# Patient Record
Sex: Female | Born: 1935 | Race: Black or African American | Hispanic: No | Smoking: Former smoker
Health system: Southern US, Community
[De-identification: ages and names within clinical notes are randomized; demographics above are authoritative.]

## PROBLEM LIST (undated history)

## (undated) DIAGNOSIS — I10 Essential (primary) hypertension: Secondary | ICD-10-CM

## (undated) DIAGNOSIS — I714 Abdominal aortic aneurysm, without rupture: Secondary | ICD-10-CM

## (undated) DIAGNOSIS — J189 Pneumonia, unspecified organism: Secondary | ICD-10-CM

## (undated) DIAGNOSIS — M1990 Unspecified osteoarthritis, unspecified site: Secondary | ICD-10-CM

## (undated) HISTORY — DX: Essential (primary) hypertension: I10

## (undated) HISTORY — DX: Abdominal aortic aneurysm, without rupture: I71.4

---

## 2012-01-15 DIAGNOSIS — J189 Pneumonia, unspecified organism: Secondary | ICD-10-CM

## 2012-01-15 DIAGNOSIS — I714 Abdominal aortic aneurysm, without rupture, unspecified: Secondary | ICD-10-CM

## 2012-01-15 HISTORY — DX: Pneumonia, unspecified organism: J18.9

## 2012-01-15 HISTORY — DX: Abdominal aortic aneurysm, without rupture, unspecified: I71.40

## 2012-01-15 HISTORY — DX: Abdominal aortic aneurysm, without rupture: I71.4

## 2012-07-27 ENCOUNTER — Encounter (HOSPITAL_BASED_OUTPATIENT_CLINIC_OR_DEPARTMENT_OTHER): Payer: Self-pay | Admitting: *Deleted

## 2012-07-27 ENCOUNTER — Emergency Department (HOSPITAL_BASED_OUTPATIENT_CLINIC_OR_DEPARTMENT_OTHER): Payer: Medicare Other

## 2012-07-27 ENCOUNTER — Inpatient Hospital Stay (HOSPITAL_BASED_OUTPATIENT_CLINIC_OR_DEPARTMENT_OTHER)
Admission: EM | Admit: 2012-07-27 | Discharge: 2012-07-28 | DRG: 690 | Disposition: A | Discharge status: Home / Self Care | Payer: Medicare Other | Attending: Internal Medicine | Admitting: Internal Medicine

## 2012-07-27 DIAGNOSIS — N390 Urinary tract infection, site not specified: Principal | ICD-10-CM | POA: Diagnosis present

## 2012-07-27 DIAGNOSIS — A419 Sepsis, unspecified organism: Secondary | ICD-10-CM

## 2012-07-27 DIAGNOSIS — J9819 Other pulmonary collapse: Secondary | ICD-10-CM | POA: Diagnosis present

## 2012-07-27 DIAGNOSIS — Z87891 Personal history of nicotine dependence: Secondary | ICD-10-CM

## 2012-07-27 DIAGNOSIS — D72829 Elevated white blood cell count, unspecified: Secondary | ICD-10-CM | POA: Diagnosis present

## 2012-07-27 DIAGNOSIS — D649 Anemia, unspecified: Secondary | ICD-10-CM | POA: Diagnosis present

## 2012-07-27 DIAGNOSIS — R531 Weakness: Secondary | ICD-10-CM | POA: Diagnosis present

## 2012-07-27 DIAGNOSIS — R5381 Other malaise: Secondary | ICD-10-CM | POA: Diagnosis present

## 2012-07-27 DIAGNOSIS — E876 Hypokalemia: Secondary | ICD-10-CM | POA: Diagnosis present

## 2012-07-27 HISTORY — DX: Unspecified osteoarthritis, unspecified site: M19.90

## 2012-07-27 LAB — CBC WITH DIFFERENTIAL/PLATELET
Basophils Absolute: 0 10*3/uL (ref 0.0–0.1)
Basophils Relative: 0 % (ref 0–1)
Eosinophils Relative: 0 % (ref 0–5)
Lymphocytes Relative: 4 % — ABNORMAL LOW (ref 12–46)
Lymphs Abs: 0.7 10*3/uL (ref 0.7–4.0)
MCH: 29.2 pg (ref 26.0–34.0)
MCHC: 36.4 g/dL — ABNORMAL HIGH (ref 30.0–36.0)
MCV: 79.6 fL (ref 78.0–100.0)
Monocytes Relative: 5 % (ref 3–12)
Myelocytes: 0 %
Neutro Abs: 6.5 10*3/uL (ref 1.7–7.7)
Neutrophils Relative %: 64 % (ref 43–77)
Platelets: 183 10*3/uL (ref 150–400)
Platelets: 170 10*3/uL (ref 150–400)
Promyelocytes Absolute: 0 %
RBC: 3.48 MIL/uL — ABNORMAL LOW (ref 3.87–5.11)
WBC Morphology: INCREASED
WBC: 17.2 10*3/uL — ABNORMAL HIGH (ref 4.0–10.5)
nRBC: 0 /100 WBC

## 2012-07-27 LAB — URINALYSIS, ROUTINE W REFLEX MICROSCOPIC
Glucose, UA: NEGATIVE mg/dL
Hgb urine dipstick: NEGATIVE
Ketones, ur: 15 mg/dL — AB
Ketones, ur: NEGATIVE mg/dL
Nitrite: NEGATIVE
Protein, ur: NEGATIVE mg/dL
Urobilinogen, UA: 4 mg/dL — ABNORMAL HIGH (ref 0.0–1.0)
pH: 5.5 (ref 5.0–8.0)

## 2012-07-27 LAB — COMPREHENSIVE METABOLIC PANEL
ALT: 51 U/L — ABNORMAL HIGH (ref 0–35)
Alkaline Phosphatase: 230 U/L — ABNORMAL HIGH (ref 39–117)
BUN: 18 mg/dL (ref 6–23)
Calcium: 8 mg/dL — ABNORMAL LOW (ref 8.4–10.5)
GFR calc Af Amer: 52 mL/min — ABNORMAL LOW (ref >90)
GFR calc Af Amer: 54 mL/min — ABNORMAL LOW (ref >90)
Glucose, Bld: 96 mg/dL (ref 70–99)
Glucose, Bld: 130 mg/dL — ABNORMAL HIGH (ref 70–99)
Potassium: 2.7 mEq/L — CL (ref 3.5–5.1)
Sodium: 136 mEq/L (ref 135–145)
Sodium: 136 mEq/L (ref 135–145)
Total Protein: 6.6 g/dL (ref 6.0–8.3)
Total Protein: 6.2 g/dL (ref 6.0–8.3)

## 2012-07-27 LAB — URINE MICROSCOPIC-ADD ON

## 2012-07-27 LAB — MAGNESIUM: Magnesium: 2 mg/dL (ref 1.5–2.5)

## 2012-07-27 LAB — PROTIME-INR
INR: 1.21 (ref 0.00–1.49)
Prothrombin Time: 15 seconds (ref 11.6–15.2)

## 2012-07-27 LAB — TSH: TSH: 0.799 u[IU]/mL (ref 0.350–4.500)

## 2012-07-27 LAB — APTT: aPTT: 31 seconds (ref 24–37)

## 2012-07-27 LAB — CG4 I-STAT (LACTIC ACID): Lactic Acid, Venous: 3.61 mmol/L — ABNORMAL HIGH (ref 0.5–2.2)

## 2012-07-27 MED ORDER — SODIUM CHLORIDE 0.9 % IJ SOLN: 3.0000 mL | Freq: Two times a day (BID) | INTRAMUSCULAR | Status: DC

## 2012-07-27 MED ORDER — SENNOSIDES-DOCUSATE SODIUM 8.6-50 MG PO TABS
1.0000 | ORAL_TABLET | Freq: Every evening | ORAL | Status: DC | PRN
  Filled 2012-07-27: qty 1

## 2012-07-27 MED ORDER — POTASSIUM CHLORIDE CRYS ER 20 MEQ PO TBCR
40.0000 meq | EXTENDED_RELEASE_TABLET | Freq: Once | ORAL | Status: AC
  Administered 2012-07-27: 40 meq via ORAL
  Filled 2012-07-27: qty 2

## 2012-07-27 MED ORDER — ONDANSETRON HCL 4 MG PO TABS: 4.0000 mg | ORAL_TABLET | Freq: Four times a day (QID) | ORAL | Status: DC | PRN

## 2012-07-27 MED ORDER — ACETAMINOPHEN 650 MG RE SUPP: 650.0000 mg | Freq: Four times a day (QID) | RECTAL | Status: DC | PRN

## 2012-07-27 MED ORDER — SODIUM CHLORIDE 0.9 % IV SOLN
INTRAVENOUS | Status: DC
  Administered 2012-07-27: 15:00:00 via INTRAVENOUS

## 2012-07-27 MED ORDER — CEFTRIAXONE SODIUM 1 G IJ SOLR
1.0000 g | INTRAMUSCULAR | Status: DC
  Administered 2012-07-27: 1 g via INTRAVENOUS
  Filled 2012-07-27 (×2): qty 10

## 2012-07-27 MED ORDER — ONDANSETRON HCL 4 MG/2ML IJ SOLN: 4.0000 mg | Freq: Four times a day (QID) | INTRAMUSCULAR | Status: DC | PRN

## 2012-07-27 MED ORDER — HYDROCODONE-ACETAMINOPHEN 5-325 MG PO TABS: 1.0000 | ORAL_TABLET | ORAL | Status: DC | PRN

## 2012-07-27 MED ORDER — MORPHINE SULFATE 2 MG/ML IJ SOLN: 1.0000 mg | INTRAMUSCULAR | Status: DC | PRN

## 2012-07-27 MED ORDER — ACETAMINOPHEN 325 MG PO TABS: 650.0000 mg | ORAL_TABLET | Freq: Four times a day (QID) | ORAL | Status: DC | PRN

## 2012-07-27 MED ORDER — SODIUM CHLORIDE 0.9 % IV SOLN
INTRAVENOUS | Status: AC
  Administered 2012-07-27: 14:00:00 via INTRAVENOUS

## 2012-07-27 MED ORDER — POTASSIUM CHLORIDE 10 MEQ/100ML IV SOLN
10.0000 meq | Freq: Once | INTRAVENOUS | Status: AC
  Administered 2012-07-27: 10 meq via INTRAVENOUS
  Filled 2012-07-27: qty 100

## 2012-07-27 NOTE — ED Notes (Signed)
Report given to [facility], Charity fundraiser. ETA 

## 2012-07-27 NOTE — ED Notes (Signed)
CRITICAL VALUE ALERT  Critical value received:  Potassium (K+) 2.7  Date of notification:  07/27/2012  Time of notification:  1046  Critical value read back:yes verified by Lavonna Rua, lab technician  Nurse who received alert:  Barron Alvine, RN  MD notified (1st page):  Dr. Judd Lien  Time of first page:  [phone]

## 2012-07-27 NOTE — ED Notes (Signed)
Report given to [facility] 1436-1.

## 2012-07-27 NOTE — ED Provider Notes (Signed)
History    CSN: [id number] Arrival date & time 07/27/12  4782  First MD Initiated Contact with Patient 07/27/12 [phone]     Chief Complaint  Patient presents with  . weak,dehydrated,decreased appetite    (Consider location/radiation/quality/duration/timing/severity/associated sxs/prior Treatment) HPI Comments: 79 year old female with no PMH, no meds.  Per family she hasn't been to the doctor in 30 years.  She presents today with complaints of weakness, nausea, decreased apetite, chills for the past several days.  She feels like her mouth is dry but has no other specific complaints.  No cough, shortness of breath, abd pain, dysuria, etc.    Patient is a 79 y.o. female presenting with weakness. The history is provided by the patient.  Weakness This is a new problem. Episode onset: several days ago. The problem occurs constantly. The problem has been gradually worsening. Pertinent negatives include no chest pain, no abdominal pain, no headaches and no shortness of breath. Nothing aggravates the symptoms. Nothing relieves the symptoms. She has tried nothing for the symptoms.   History reviewed. No pertinent past medical history. History reviewed. No pertinent past surgical history. History reviewed. No pertinent family history. History  Substance Use Topics  . Smoking status: Not on file  . Smokeless tobacco: Not on file  . Alcohol Use: No   OB History   Grav Para Term Preterm Abortions TAB SAB Ect Mult Living                 Review of Systems  Constitutional: Positive for chills, activity change, appetite change and fatigue. Negative for fever and diaphoresis.  Respiratory: Negative for shortness of breath.   Cardiovascular: Negative for chest pain.  Gastrointestinal: Negative for abdominal pain.  Neurological: Positive for weakness. Negative for headaches.  All other systems reviewed and are negative.    Allergies  Review of patient's allergies indicates no known  allergies.  Home Medications  No current outpatient prescriptions on file. BP 100/55  Pulse 113  Temp(Src) 100.6 F (38.1 C) (Oral)  Resp 18  Ht 5\' 4"  (1.626 m)  Wt 130 lb (58.968 kg)  BMI 22.3 kg/m2  SpO2 97% Physical Exam  Nursing note and vitals reviewed. Constitutional: She is oriented to person, place, and time. She appears well-developed and well-nourished. No distress.  Elderly female in no distress.  HENT:  Head: Normocephalic and atraumatic.  MM's are somewhat dry.  Eyes: EOM are normal. Pupils are equal, round, and reactive to light.  Neck: Normal range of motion. Neck supple.  Cardiovascular: Normal rate and regular rhythm.  Exam reveals no gallop and no friction rub.   No murmur heard. Pulmonary/Chest: Effort normal and breath sounds normal. No respiratory distress. She has no wheezes.  Abdominal: Soft. Bowel sounds are normal. She exhibits no distension. There is no tenderness.  Musculoskeletal: Normal range of motion. She exhibits no edema.  Neurological: She is alert and oriented to person, place, and time. No cranial nerve deficit. She exhibits normal muscle tone. Coordination normal.  Skin: Skin is warm and dry. She is not diaphoretic.    ED Course  Procedures (including critical care time) Labs Reviewed  CULTURE, BLOOD (ROUTINE X 2)  CULTURE, BLOOD (ROUTINE X 2)  URINE CULTURE  CBC WITH DIFFERENTIAL  COMPREHENSIVE METABOLIC PANEL  TROPONIN I  URINALYSIS, ROUTINE W REFLEX MICROSCOPIC  LACTIC ACID, PLASMA   No results found. No diagnosis found.   Date: 07/27/2012  Rate: 113  Rhythm: sinus tachycardia  QRS Axis: normal,  borderline LAD  Intervals: normal  ST/T Wave abnormalities: nonspecific T wave changes; septal infarct, age undetermined.  Conduction Disutrbances:none  Narrative Interpretation:   Old EKG Reviewed: none available    MDM  The vitals and workup are suggestive of dehydration, possible early uropsepsis.  She was also found to be  hypokalemic.  Potassium replaced, cultures obtained, will give rocephin.  I have spoken with Dr. Elisabeth Pigeon from [facility] (family requests this facility) who agrees to accept in transfer.    CRITICAL CARE Performed by: Geoffery Lyons Total critical care time: 30 minutes Critical care time was exclusive of separately billable procedures and treating other patients. Critical care was necessary to treat or prevent imminent or life-threatening deterioration. Critical care was time spent personally by me on the following activities: development of treatment plan with patient and/or surrogate as well as nursing, discussions with consultants, evaluation of patient's response to treatment, examination of patient, obtaining history from patient or surrogate, ordering and performing treatments and interventions, ordering and review of laboratory studies, ordering and review of radiographic studies, pulse oximetry and re-evaluation of patient's condition.    Geoffery Lyons, MD 07/27/12 [phone]

## 2012-07-27 NOTE — ED Notes (Signed)
Pt reports unable to void at this time. Specimen cup at bedside and patient instructed to use call bell when able to go to bathroom.

## 2012-07-27 NOTE — ED Notes (Signed)
Pt reports burning- No redness or infiltration noted. IV infusing well. Per request of patient, infusion rate decreased to 52mL/hr to aid in discomfort.

## 2012-07-27 NOTE — ED Notes (Signed)
Patient back from  X-ray 

## 2012-07-27 NOTE — ED Notes (Signed)
[facility] at bedside preparing pt for transport to [facility]

## 2012-07-27 NOTE — Progress Notes (Signed)
Patient does not want to sign up for My Chart. Pt does not want proxy forms for My Chart. Briscoe Burns BSN, RN-BC Admissions RN  07/27/2012 3:53 PM

## 2012-07-27 NOTE — ED Notes (Signed)
Patient transported to X-ray via stretcher 

## 2012-07-27 NOTE — ED Notes (Signed)
Pt eating waffle fries and applesauce, drinking water and ginger-ale-tolerating well at this time. No N/V episodes.

## 2012-07-27 NOTE — H&P (Signed)
[facility] History and Physical  Sydney Scott ZOX:[id number] DOB: [birth year] DOA: 07/27/2012  Referring physician: ER physician PCP: No PCP Per Patient   Chief Complaint: weakness, poor apetite  HPI:  79 year old female with no significant past medical history initially presented to [facility] for weakness, fatigue and poor oral intake for past few days prior to this admission. Family has noticed she has been engaging in conversation much less. Patient had fevers at home, unmeasured. No reports of dysuria or hematuria or frequency. No chest pain, no shortness of breath, no palpitations. No abdominal pain, nausea or vomiting. No falls or loss of consciousness. In ED, her vitals were as follows: BP 99/61, Tmax of 100.6 F, HR 99 and O2 saturation of 97% on room air. Her initial blood work was significant only for hypokalemia of 2.6 which was repleted in ED. Hemoglobin was 11.4. Urinalysis was significant for UTI.   Assessment and Plan:  Principal Problem:   Weakness - secondary to an acute infection, UTI - start rocephin - follow up urine culture results - PT eval Active Problems:   UTI (urinary tract infection) - started rocephin - follow up urine culture results   Leukocytosis, unspecified - secondary to UTI   Anemia - hemoglobin stable at 11.4 on admission   Hypokalemia - repleted in ED    Manson Passey [facility] [phone]  Review of Systems:  Constitutional: Negative for fever, chills and positive for malaise/fatigue. Negative for diaphoresis.  HENT: Negative for hearing loss, ear pain, nosebleeds, congestion, sore throat, neck pain, tinnitus and ear discharge.   Eyes: Negative for blurred vision, double vision, photophobia, pain, discharge and redness.  Respiratory: Negative for cough, hemoptysis, sputum production, shortness of breath, wheezing and stridor.   Cardiovascular: Negative for chest pain, palpitations, orthopnea, claudication and leg swelling.  Gastrointestinal:  Negative for nausea, vomiting and abdominal pain. Negative for heartburn, constipation, blood in stool and melena.  Genitourinary: Negative for dysuria, urgency, frequency, hematuria and flank pain.  Musculoskeletal: Negative for myalgias, back pain, joint pain and falls.  Skin: Negative for itching and rash.  Neurological: Negative for dizziness and positive for weakness. Negative for tingling, tremors, sensory change, speech change, focal weakness, loss of consciousness and headaches.  Endo/Heme/Allergies: Negative for environmental allergies and polydipsia. Does not bruise/bleed easily. positive for loss of apetite Psychiatric/Behavioral: Negative for suicidal ideas. The patient is not nervous/anxious.      Past Medical History  Diagnosis Date  . Arthritis    Past Surgical History  Procedure Laterality Date  . No past surgeries     Social History:  reports that she quit smoking about 34 years ago. Her smoking use included Cigarettes. She smoked 0.00 packs per day for 20 years. She has never used smokeless tobacco. She reports that she does not drink alcohol or use illicit drugs.  Allergies  Allergen Reactions  . Aspirin Nausea And Vomiting    Family History: Family medical history significant for HTN, HLD   Prior to Admission medications   Not on File   Physical Exam: Filed Vitals:   07/27/12 1040 07/27/12 1055 07/27/12 1259 07/27/12 1412  BP: 99/61 102/58 104/63 101/60  Pulse: 110 106 102 99  Temp:  99.5 F (37.5 C)  98.4 F (36.9 C)  TempSrc:  Oral  Oral  Resp: 18 18  20   Height:      Weight:    69.219 kg (152 lb 9.6 oz)  SpO2: 94% 96%  97%    Physical  Exam  Constitutional: Appears in no acute distress.  HENT: Normocephalic. External right and left ear normal. Oropharynx is clear and moist.  Eyes: Conjunctivae and EOM are normal. PERRLA, no scleral icterus.  Neck: Normal ROM. Neck supple. No JVD. No tracheal deviation. No thyromegaly.  CVS: RRR, S1/S2  appreciated  Pulmonary: Effort and breath sounds normal, no stridor, rhonchi, wheezes, rales.  Abdominal: Soft. BS +,  no distension, tenderness, rebound or guarding.  Musculoskeletal: Normal range of motion. No edema and no tenderness.  Lymphadenopathy: No lymphadenopathy noted, cervical, inguinal. Neuro: Alert. No focal neurologic deficits Skin: Skin is warm and dry. No rash noted. Not diaphoretic. No erythema. No pallor.    Labs on Admission:  Basic Metabolic Panel:  Recent Labs Lab 07/27/12 0950 07/27/12 1510  NA 136 136  K 2.7* 3.6  CL 99 102  CO2 19 21  GLUCOSE 96 130*  BUN 17 18  CREATININE 1.10 1.07  CALCIUM 8.7 8.0*  MG  --  2.0  PHOS  --  2.2*   Liver Function Tests:  Recent Labs Lab 07/27/12 0950 07/27/12 1510  AST 76* 79*  ALT 51* 46*  ALKPHOS 230* 182*  BILITOT 3.4* 3.1*  PROT 6.6 6.2  ALBUMIN 2.3* 2.0*   No results found for this basename: LIPASE, AMYLASE,  in the last 168 hours No results found for this basename: AMMONIA,  in the last 168 hours CBC:  Recent Labs Lab 07/27/12 0950 07/27/12 1510  WBC 7.3 17.2*  NEUTROABS 6.5 15.6*  HGB 11.4* 10.1*  HCT 31.3* 27.7*  MCV 80.1 79.6  PLT 183 170   Cardiac Enzymes:  Recent Labs Lab 07/27/12 0950  TROPONINI <0.30   BNP: No components found with this basename: POCBNP,  CBG: No results found for this basename: GLUCAP,  in the last 168 hours  Radiological Exams on Admission: Dg Chest 2 View 07/27/2012     IMPRESSION: Mild elevation of the right hemidiaphragm with right basilar atelectasis.  No segmental infiltrate or pulmonary edema.   Original Report Authenticated By: Natasha Mead, M.D.    Code Status: Full Family Communication: Pt at bedside Disposition Plan: Admit for further evaluation  Manson Passey, MD  [facility] Pager [phone]  If 7PM-7AM, please contact night-coverage [email or web address] Password [email or web address] 07/27/2012, 6:46 PM

## 2012-07-27 NOTE — ED Notes (Signed)
MD at bedside. 

## 2012-07-28 DIAGNOSIS — D649 Anemia, unspecified: Secondary | ICD-10-CM

## 2012-07-28 LAB — COMPREHENSIVE METABOLIC PANEL
ALT: 41 U/L — ABNORMAL HIGH (ref 0–35)
AST: 53 U/L — ABNORMAL HIGH (ref 0–37)
Albumin: 2 g/dL — ABNORMAL LOW (ref 3.5–5.2)
CO2: 24 mEq/L (ref 19–32)
Chloride: 108 mEq/L (ref 96–112)
GFR calc non Af Amer: 75 mL/min — ABNORMAL LOW (ref >90)
Sodium: 139 mEq/L (ref 135–145)
Total Bilirubin: 1.3 mg/dL — ABNORMAL HIGH (ref 0.3–1.2)

## 2012-07-28 LAB — CBC
Hemoglobin: 10.2 g/dL — ABNORMAL LOW (ref 12.0–15.0)
MCH: 28.4 pg (ref 26.0–34.0)
MCHC: 35.7 g/dL (ref 30.0–36.0)
MCV: 79.7 fL (ref 78.0–100.0)
Platelets: 197 10*3/uL (ref 150–400)
RBC: 3.59 MIL/uL — ABNORMAL LOW (ref 3.87–5.11)

## 2012-07-28 LAB — URINE CULTURE
Colony Count: NO GROWTH
Culture: NO GROWTH

## 2012-07-28 LAB — GLUCOSE, CAPILLARY: Glucose-Capillary: 97 mg/dL (ref 70–99)

## 2012-07-28 MED ORDER — CIPROFLOXACIN HCL 500 MG PO TABS: 500.0000 mg | ORAL_TABLET | Freq: Two times a day (BID) | ORAL | Status: DC

## 2012-07-28 NOTE — Discharge Summary (Signed)
Physician Discharge Summary  Sydney Scott WJX:[id number] DOB: 19-Mar-1927 DOA: 07/27/2012  PCP: No PCP Per Patient  Admit date: 07/27/2012 Discharge date: 07/28/2012  Recommendations for Outpatient Follow-up:  1. Pt will need to follow up with PCP in 2-3 weeks post discharge 2. Please obtain BMP to evaluate electrolytes and kidney function 3. Please also check CBC to evaluate Hg and Hct levels 4. Please note that pt was discharged home on Ciprofloxacin to complete the therapy for UTI for 5 more days post discharge  5. Please also note that blood culture obtained July 14th, 2014 grew MICROAEROPHILIC STREPTOCOCCI 1/2 and this was discussed with ID, determined to be skin contamination 6. Please also note that [facility] PT services offered but was walking independently in hallway, not requiring assistance 7. Please also note that pt was advised to follow up with PCP as soon as possible if her symptoms get worse   Discharge Diagnoses:  Principal Problem:   Weakness Active Problems:   UTI (urinary tract infection)   Leukocytosis, unspecified   Anemia   Hypokalemia  Discharge Condition: Stable  Diet recommendation: Heart healthy diet discussed in details   History of present illness:  79 year old female with no significant past medical history initially presented to [facility] for weakness, fatigue and poor oral intake for past few days prior to this admission. Family has noticed she has been engaging in conversation much less. Patient had fevers at home, unmeasured. No reports of dysuria or hematuria or frequency. No chest pain, no shortness of breath, no palpitations. No abdominal pain, nausea or vomiting. No falls or loss of consciousness.   In ED, her vitals were as follows: BP 99/61, Tmax of 100.6 F, HR 99 and O2 saturation of 97% on room air. Her initial blood work was significant only for hypokalemia of 2.6 which was repleted in ED. Hemoglobin was 11.4. Urinalysis was significant for UTI.    Assessment and Plan:  Principal Problem:  Weakness  - secondary to an acute infection, UTI  - started rocephin and transitioned to Ciprofloxacin upon discharge to complete the therapy for 5 more days post discharge - urine culture with no significant growth - PT eval done and determined pt has no acute home health needs  Active Problems:  UTI (urinary tract infection)  - started rocephin and transitioned to Cipro as noted above to complete therapy for 5 more days post discharge  - follow up urine culture results  Leukocytosis, unspecified  - secondary to UTI, WBC trending down   Anemia  - hemoglobin stable and no signs of acute bleeding while inpatient, no need for transfusion  Hypokalemia  - repleted in ED   Procedures/Studies: Dg Chest 2 View 07/27/2012    Mild elevation of the right hemidiaphragm with right basilar atelectasis.  No segmental infiltrate or pulmonary edema.    Consultations:  None  Antibiotics:  Rocephin 7/14 -->and transition to Ciprofloxacin on 7/15 for 5 more days post discharge   Discharge Exam: Filed Vitals:   07/28/12 0639  BP: 107/56  Pulse: 92  Temp: 97.8 F (36.6 C)  Resp: 18   Filed Vitals:   07/27/12 1412 07/27/12 2150 07/28/12 0500 07/28/12 0639  BP: 101/60 102/67  107/56  Pulse: 99 76  92  Temp: 98.4 F (36.9 C) 97.8 F (36.6 C)  97.8 F (36.6 C)  TempSrc: Oral Oral  Oral  Resp: 20 18  18   Height:      Weight: 69.219 kg (152 lb 9.6  oz)  69.809 kg (153 lb 14.4 oz) 69.809 kg (153 lb 14.4 oz)  SpO2: 97% 98%  99%    General: Pt is alert, follows commands appropriately, not in acute distress Cardiovascular: Regular rate and rhythm, S1/S2 +, no murmurs, no rubs, no gallops Respiratory: Clear to auscultation bilaterally, no wheezing, no crackles, no rhonchi Abdominal: Soft, non tender, non distended, bowel sounds +, no guarding Extremities: no edema, no cyanosis, pulses palpable bilaterally DP and PT Neuro: Grossly  nonfocal  Discharge Instructions  Discharge Orders   Future Orders Complete By Expires     Diet - low sodium heart healthy  As directed     Increase activity slowly  As directed         Medication List         ciprofloxacin 500 MG tablet  Commonly known as:  CIPRO  Take 1 tablet (500 mg total) by mouth 2 (two) times daily.           Follow-up Information   Follow up with Debbora Presto, MD. (As needed if symptoms worsen)    Contact information:   [address] [place] [address] [phone]        The results of significant diagnostics from this hospitalization (including imaging, microbiology, ancillary and laboratory) are listed below for reference.     Microbiology: Recent Results (from the past 240 hour(s))  CULTURE, BLOOD (ROUTINE X 2)     Status: None   Collection Time    07/27/12  9:50 AM      Result Value Range Status   Specimen Description BLOOD RT HAND   Final   Special Requests Normal BOTTLES DRAWN AEROBIC AND ANAEROBIC  4CC   Final   Culture  Setup Time 07/27/2012 15:07   Final   Culture     Final   Value:        BLOOD CULTURE RECEIVED NO GROWTH TO DATE CULTURE WILL BE HELD FOR 5 DAYS BEFORE ISSUING A FINAL NEGATIVE REPORT   Report Status PENDING   Incomplete  CULTURE, BLOOD (ROUTINE X 2)     Status: None   Collection Time    07/27/12 10:10 AM      Result Value Range Status   Specimen Description BLOOD LEFT HAND   Final   Special Requests Normal BOTTLES DRAWN AEROBIC ONLY  3CC   Final   Culture  Setup Time 07/27/2012 15:07   Final   Culture     Final   Value:        BLOOD CULTURE RECEIVED NO GROWTH TO DATE CULTURE WILL BE HELD FOR 5 DAYS BEFORE ISSUING A FINAL NEGATIVE REPORT   Report Status PENDING   Incomplete  CULTURE, BLOOD (SINGLE)     Status: None   Collection Time    07/27/12  1:00 PM      Result Value Range Status   Specimen Description BLOOD R HAND   Final   Special Requests BOTTLES DRAWN AEROBIC AND  ANAEROBIC 4.5 CC   Final   Culture  Setup Time 07/27/2012 15:07   Final   Culture     Final   Value:        BLOOD CULTURE RECEIVED NO GROWTH TO DATE CULTURE WILL BE HELD FOR 5 DAYS BEFORE ISSUING A FINAL NEGATIVE REPORT   Report Status PENDING   Incomplete     Labs: Basic Metabolic Panel:  Recent Labs Lab 07/27/12 0950 07/27/12 1510 07/28/12 0438  NA 136  136 139  K 2.7* 3.6 3.7  CL 99 102 108  CO2 19 21 24   GLUCOSE 96 130* 99  BUN 17 18 16   CREATININE 1.10 1.07 0.79  CALCIUM 8.7 8.0* 8.2*  MG  --  2.0  --   PHOS  --  2.2*  --    Liver Function Tests:  Recent Labs Lab 07/27/12 0950 07/27/12 1510 07/28/12 0438  AST 76* 79* 53*  ALT 51* 46* 41*  ALKPHOS 230* 182* 173*  BILITOT 3.4* 3.1* 1.3*  PROT 6.6 6.2 6.1  ALBUMIN 2.3* 2.0* 2.0*   CBC:  Recent Labs Lab 07/27/12 0950 07/27/12 1510 07/28/12 0438  WBC 7.3 17.2* 15.6*  NEUTROABS 6.5 15.6*  --   HGB 11.4* 10.1* 10.2*  HCT 31.3* 27.7* 28.6*  MCV 80.1 79.6 79.7  PLT 183 170 197   Cardiac Enzymes:  Recent Labs Lab 07/27/12 0950  TROPONINI <0.30   CBG:  Recent Labs Lab 07/28/12 0726  GLUCAP 97     SIGNED: Time coordinating discharge: Over 30 minutes  Debbora Presto, MD  [facility] 07/28/2012, 12:57 PM Pager [phone]  If 7PM-7AM, please contact night-coverage [email or web address] Password [email or web address]

## 2012-07-28 NOTE — Progress Notes (Signed)
PT Cancellation Note  Patient Details Name: Sydney Scott MRN: [id number] DOB: 1927/08/19   Cancelled Treatment:    Reason Eval/Treat Not Completed: Patient declined, no reason specified (pt stated she's walking fine and doesn't need PT). PT signing off.    Ralene Bathe Kistler 07/28/2012, 1:18 PM [phone]

## 2012-07-28 NOTE — Progress Notes (Signed)
[facility] called to report positive blood cultures in anaerobic bottle, gram + cocci in pair; notified Dr. Izola Price

## 2012-07-28 NOTE — Progress Notes (Signed)
Pt discharged home via family; Pt and family given and explained all discharge instructions, carenotes, and prescriptions; pt and family stated understanding and denied questions/concerns; all f/u appointments in place; IV removed without complicaitons; pt stable at time of discharge  

## 2012-07-30 LAB — CULTURE, BLOOD (ROUTINE X 2): Special Requests: NORMAL

## 2012-08-02 ENCOUNTER — Inpatient Hospital Stay (HOSPITAL_COMMUNITY)
Admission: EM | Admit: 2012-08-02 | Discharge: 2012-08-11 | DRG: 193 | Disposition: A | Discharge status: Home Health | Payer: Medicare Other | Attending: Internal Medicine | Admitting: Internal Medicine

## 2012-08-02 ENCOUNTER — Encounter (HOSPITAL_COMMUNITY): Payer: Self-pay | Admitting: *Deleted

## 2012-08-02 ENCOUNTER — Emergency Department (HOSPITAL_COMMUNITY): Payer: Medicare Other

## 2012-08-02 DIAGNOSIS — R627 Adult failure to thrive: Secondary | ICD-10-CM | POA: Diagnosis present

## 2012-08-02 DIAGNOSIS — E876 Hypokalemia: Secondary | ICD-10-CM | POA: Diagnosis present

## 2012-08-02 DIAGNOSIS — D72829 Elevated white blood cell count, unspecified: Secondary | ICD-10-CM | POA: Diagnosis present

## 2012-08-02 DIAGNOSIS — R7881 Bacteremia: Secondary | ICD-10-CM | POA: Diagnosis present

## 2012-08-02 DIAGNOSIS — I714 Abdominal aortic aneurysm, without rupture, unspecified: Secondary | ICD-10-CM | POA: Diagnosis present

## 2012-08-02 DIAGNOSIS — D509 Iron deficiency anemia, unspecified: Secondary | ICD-10-CM | POA: Diagnosis present

## 2012-08-02 DIAGNOSIS — D649 Anemia, unspecified: Secondary | ICD-10-CM

## 2012-08-02 DIAGNOSIS — R531 Weakness: Secondary | ICD-10-CM

## 2012-08-02 DIAGNOSIS — A491 Streptococcal infection, unspecified site: Secondary | ICD-10-CM | POA: Diagnosis present

## 2012-08-02 DIAGNOSIS — K8020 Calculus of gallbladder without cholecystitis without obstruction: Secondary | ICD-10-CM | POA: Diagnosis present

## 2012-08-02 DIAGNOSIS — Y95 Nosocomial condition: Secondary | ICD-10-CM | POA: Diagnosis present

## 2012-08-02 DIAGNOSIS — N390 Urinary tract infection, site not specified: Secondary | ICD-10-CM

## 2012-08-02 DIAGNOSIS — J189 Pneumonia, unspecified organism: Secondary | ICD-10-CM | POA: Diagnosis present

## 2012-08-02 DIAGNOSIS — K750 Abscess of liver: Secondary | ICD-10-CM | POA: Diagnosis present

## 2012-08-02 DIAGNOSIS — A599 Trichomoniasis, unspecified: Secondary | ICD-10-CM | POA: Diagnosis present

## 2012-08-02 DIAGNOSIS — Z87440 Personal history of urinary (tract) infections: Secondary | ICD-10-CM

## 2012-08-02 DIAGNOSIS — K5730 Diverticulosis of large intestine without perforation or abscess without bleeding: Secondary | ICD-10-CM | POA: Diagnosis present

## 2012-08-02 LAB — URINALYSIS, ROUTINE W REFLEX MICROSCOPIC
Glucose, UA: NEGATIVE mg/dL
Ketones, ur: 15 mg/dL — AB
Nitrite: NEGATIVE
Specific Gravity, Urine: 1.017 (ref 1.005–1.030)
pH: 6 (ref 5.0–8.0)

## 2012-08-02 LAB — CULTURE, BLOOD (SINGLE): Culture: NO GROWTH

## 2012-08-02 LAB — CULTURE, BLOOD (ROUTINE X 2): Special Requests: NORMAL

## 2012-08-02 LAB — URINE MICROSCOPIC-ADD ON

## 2012-08-02 LAB — BASIC METABOLIC PANEL
BUN: 9 mg/dL (ref 6–23)
CO2: 27 mEq/L (ref 19–32)
Calcium: 8.3 mg/dL — ABNORMAL LOW (ref 8.4–10.5)
Chloride: 98 mEq/L (ref 96–112)
Creatinine, Ser: 0.61 mg/dL (ref 0.50–1.10)
Glucose, Bld: 112 mg/dL — ABNORMAL HIGH (ref 70–99)

## 2012-08-02 LAB — CBC WITH DIFFERENTIAL/PLATELET
Basophils Absolute: 0 10*3/uL (ref 0.0–0.1)
Eosinophils Relative: 0 % (ref 0–5)
HCT: 27.8 % — ABNORMAL LOW (ref 36.0–46.0)
Hemoglobin: 10.1 g/dL — ABNORMAL LOW (ref 12.0–15.0)
Lymphocytes Relative: 10 % — ABNORMAL LOW (ref 12–46)
MCV: 77.9 fL — ABNORMAL LOW (ref 78.0–100.0)
Monocytes Absolute: 0.8 10*3/uL (ref 0.1–1.0)
Monocytes Relative: 4 % (ref 3–12)
Neutro Abs: 16.6 10*3/uL — ABNORMAL HIGH (ref 1.7–7.7)
RDW: 14.4 % (ref 11.5–15.5)
WBC: 19.3 10*3/uL — ABNORMAL HIGH (ref 4.0–10.5)

## 2012-08-02 LAB — POTASSIUM: Potassium: 3.1 mEq/L — ABNORMAL LOW (ref 3.5–5.1)

## 2012-08-02 LAB — LACTIC ACID, PLASMA: Lactic Acid, Venous: 1.6 mmol/L (ref 0.5–2.2)

## 2012-08-02 MED ORDER — SODIUM CHLORIDE 0.9 % IV BOLUS (SEPSIS)
1000.0000 mL | Freq: Once | INTRAVENOUS | Status: AC
  Administered 2012-08-02: 1000 mL via INTRAVENOUS

## 2012-08-02 MED ORDER — HEPARIN SODIUM (PORCINE) 5000 UNIT/ML IJ SOLN
5000.0000 [IU] | Freq: Three times a day (TID) | INTRAMUSCULAR | Status: DC
  Administered 2012-08-02 – 2012-08-11 (×25): 5000 [IU] via SUBCUTANEOUS
  Filled 2012-08-02 (×29): qty 1

## 2012-08-02 MED ORDER — POTASSIUM CHLORIDE CRYS ER 20 MEQ PO TBCR
40.0000 meq | EXTENDED_RELEASE_TABLET | Freq: Once | ORAL | Status: AC
  Administered 2012-08-02: 40 meq via ORAL
  Filled 2012-08-02: qty 2

## 2012-08-02 MED ORDER — PIPERACILLIN-TAZOBACTAM 3.375 G IVPB
3.3750 g | Freq: Three times a day (TID) | INTRAVENOUS | Status: DC
  Administered 2012-08-03 – 2012-08-11 (×25): 3.375 g via INTRAVENOUS
  Filled 2012-08-02 (×27): qty 50

## 2012-08-02 MED ORDER — SODIUM CHLORIDE 0.9 % IV SOLN
INTRAVENOUS | Status: DC
  Administered 2012-08-02: 18:00:00 via INTRAVENOUS

## 2012-08-02 MED ORDER — ALBUTEROL SULFATE (5 MG/ML) 0.5% IN NEBU: 2.5000 mg | INHALATION_SOLUTION | RESPIRATORY_TRACT | Status: DC | PRN

## 2012-08-02 MED ORDER — POTASSIUM CHLORIDE 10 MEQ/100ML IV SOLN
10.0000 meq | Freq: Once | INTRAVENOUS | Status: AC
  Administered 2012-08-02: 10 meq via INTRAVENOUS
  Filled 2012-08-02: qty 100

## 2012-08-02 MED ORDER — MORPHINE SULFATE 2 MG/ML IJ SOLN
2.0000 mg | INTRAMUSCULAR | Status: DC | PRN
  Administered 2012-08-06: 2 mg via INTRAVENOUS
  Filled 2012-08-02: qty 1

## 2012-08-02 MED ORDER — OXYCODONE HCL 5 MG PO TABS: 5.0000 mg | ORAL_TABLET | ORAL | Status: DC | PRN

## 2012-08-02 MED ORDER — ONDANSETRON HCL 4 MG PO TABS: 4.0000 mg | ORAL_TABLET | Freq: Four times a day (QID) | ORAL | Status: DC | PRN

## 2012-08-02 MED ORDER — SODIUM CHLORIDE 0.9 % IV SOLN
250.0000 mL | INTRAVENOUS | Status: DC | PRN
  Administered 2012-08-09: 250 mL via INTRAVENOUS

## 2012-08-02 MED ORDER — ACETAMINOPHEN 650 MG RE SUPP: 650.0000 mg | Freq: Four times a day (QID) | RECTAL | Status: DC | PRN

## 2012-08-02 MED ORDER — SODIUM CHLORIDE 0.9 % IJ SOLN
3.0000 mL | Freq: Two times a day (BID) | INTRAMUSCULAR | Status: DC
  Administered 2012-08-02 – 2012-08-10 (×12): 3 mL via INTRAVENOUS

## 2012-08-02 MED ORDER — VANCOMYCIN HCL 500 MG IV SOLR
500.0000 mg | Freq: Two times a day (BID) | INTRAVENOUS | Status: AC
  Administered 2012-08-02 – 2012-08-04 (×5): 500 mg via INTRAVENOUS
  Filled 2012-08-02 (×7): qty 500

## 2012-08-02 MED ORDER — ONDANSETRON HCL 4 MG/2ML IJ SOLN: 4.0000 mg | Freq: Four times a day (QID) | INTRAMUSCULAR | Status: DC | PRN

## 2012-08-02 MED ORDER — ACETAMINOPHEN 325 MG PO TABS
650.0000 mg | ORAL_TABLET | Freq: Four times a day (QID) | ORAL | Status: DC | PRN
  Administered 2012-08-05 – 2012-08-06 (×3): 650 mg via ORAL
  Filled 2012-08-02 (×3): qty 2

## 2012-08-02 MED ORDER — SODIUM CHLORIDE 0.9 % IJ SOLN
3.0000 mL | INTRAMUSCULAR | Status: DC | PRN
  Administered 2012-08-08 – 2012-08-10 (×2): 3 mL via INTRAVENOUS

## 2012-08-02 MED ORDER — PIPERACILLIN-TAZOBACTAM 3.375 G IVPB 30 MIN
3.3750 g | Freq: Once | INTRAVENOUS | Status: AC
  Administered 2012-08-02: 3.375 g via INTRAVENOUS
  Filled 2012-08-02: qty 50

## 2012-08-02 NOTE — ED Provider Notes (Signed)
History    CSN: [id number] Arrival date & time 08/02/12  1431  First MD Initiated Contact with Patient 08/02/12 1512     Chief Complaint  Patient presents with  . Weakness   (Consider location/radiation/quality/duration/timing/severity/associated sxs/prior Treatment) HPI Comments: Pt is an 79yo female who presents with bilateral LE weakness and poor appetite persistent over the last week. Pt is without known significant PMH (has not seen a doctor in "30 years or so"). Recently presented with very similar symptoms 7/14, placed in observation overnight, and discharged the next day after receiving Rocephin x1 and IV fluids; pt was started on Cipro at discharge; labs in the ED significant for hypokalemia and elevated lactic acid and CXR showed only basilar atelectasis. Blood cultures showed a likely contaminant organism and urine cultures had no growth. Some subjective fever and chills, but no cough or sputum production, no N/V, no chest pain or abdominal pain, no flank pain, no dysuria, increased urge/frequency. Per family, pt has not been disoriented or confused but has been "lying around" much more than usual, due to the complaints of weakness and not eating as well as normal.  The history is provided by the patient and a relative (daughter in law). No language interpreter was used.   Past Medical History  Diagnosis Date  . Arthritis    Past Surgical History  Procedure Laterality Date  . No past surgeries     No family history on file. History  Substance Use Topics  . Smoking status: Former Smoker -- 20 years    Types: Cigarettes    Quit date: 07/28/1978  . Smokeless tobacco: Never Used  . Alcohol Use: No   OB History   Grav Para Term Preterm Abortions TAB SAB Ect Mult Living                 Review of Systems  Constitutional: Positive for fever, chills, activity change and appetite change. Negative for diaphoresis.  HENT: Negative for congestion, sore throat, rhinorrhea, neck  pain and neck stiffness.        Also complains of dry mouth  Eyes: Negative for redness, itching and visual disturbance.  Respiratory: Negative for cough, chest tightness, shortness of breath and wheezing.   Cardiovascular: Negative for chest pain, palpitations and leg swelling.  Gastrointestinal: Negative for nausea, vomiting, abdominal pain, diarrhea and constipation.       Fewer BM's than normal, but BM's themselves are unchanged  Endocrine: Negative for polydipsia and polyuria.  Genitourinary: Negative for dysuria, urgency, frequency, hematuria, flank pain, vaginal discharge and difficulty urinating.  Musculoskeletal: Negative for back pain and joint swelling.  Skin: Negative for pallor, rash and wound.  Allergic/Immunologic: Negative for food allergies.  Neurological: Positive for weakness. Negative for syncope, facial asymmetry, numbness and headaches.  Hematological: Negative for adenopathy.  Psychiatric/Behavioral: Negative for confusion, dysphoric mood and agitation. The patient is not nervous/anxious.     Allergies  Aspirin  Home Medications   Current Outpatient Rx  Name  Route  Sig  Dispense  Refill  . ciprofloxacin (CIPRO) 500 MG tablet   Oral   Take 1 tablet (500 mg total) by mouth 2 (two) times daily.   10 tablet   0    BP 116/67  Pulse 106  Temp(Src) 99.2 F (37.3 C) (Oral)  Resp 20  SpO2 96% Physical Exam  Nursing note and vitals reviewed. Constitutional: She is oriented to person, place, and time. She appears well-developed and well-nourished. No distress.  HENT:  Head: Normocephalic and atraumatic. Head is without contusion and without laceration.  Right Ear: Tympanic membrane and external ear normal. No hemotympanum.  Left Ear: Tympanic membrane and external ear normal. No hemotympanum.  Nose: Nose normal. No mucosal edema, nasal deformity or septal deviation.  Mouth/Throat: Uvula is midline. Mucous membranes are dry. No oral lesions. No edematous. No  oropharyngeal exudate, posterior oropharyngeal edema or posterior oropharyngeal erythema.  Very dry/cracked tongue and lips  Eyes: Conjunctivae are normal. Pupils are equal, round, and reactive to light. No scleral icterus.  Neck: Normal range of motion. Neck supple.  Small shotty submandibular nodes  Cardiovascular: Regular rhythm, normal heart sounds and intact distal pulses.  Tachycardia present.   No murmur heard. Pulmonary/Chest: Effort normal. No respiratory distress. She has decreased breath sounds in the right lower field and the left lower field. She has no wheezes. She has no rales. She exhibits no tenderness.  Dullness to percussion and decreased soudns in bilateral lower lung fields but no respiratory distress and otherwise clear sounds with equal air movement  Abdominal: Soft. Normal appearance and bowel sounds are normal. She exhibits no distension. There is no hepatosplenomegaly. There is no tenderness. There is no CVA tenderness.  Musculoskeletal: Normal range of motion.  Lymphadenopathy:       Head (right side): Submandibular adenopathy present.       Head (left side): Submandibular adenopathy present.  Neurological: She is alert and oriented to person, place, and time. She has normal strength. No sensory deficit. GCS eye subscore is 4. GCS verbal subscore is 5. GCS motor subscore is 6.  Skin: Skin is warm and dry. No rash noted. She is not diaphoretic.  Psychiatric: She has a normal mood and affect. Her speech is normal and behavior is normal. Thought content normal.    ED Course  Procedures (including critical care time)  1540: History and exam as above, very similar presentation to last week. Previous cultures showed no growth, so doubt true UTI. Does appear clinically dehydrated. Ordered for 1 L NS bolus. Will recheck UA and culture as well as CBC, BMP, and lactic acid. Will check CXR as well.  1624: CXR with slightly worsening right basilar opacity, consistent with  pneumonia or atelectasis. WBC remains elevated at 19. Other labs pending. Urine sample not yet collected but doubt UTI.  1700: K noted to be 2.7. Will replete. Could be contributing to weakness.  Labs Reviewed  CBC WITH DIFFERENTIAL - Abnormal; Notable for the following:    WBC 19.3 (*)    RBC 3.57 (*)    Hemoglobin 10.1 (*)    HCT 27.8 (*)    MCV 77.9 (*)    MCHC 36.3 (*)    Neutrophils Relative % 86 (*)    Neutro Abs 16.6 (*)    Lymphocytes Relative 10 (*)    All other components within normal limits  BASIC METABOLIC PANEL - Abnormal; Notable for the following:    Potassium 2.7 (*)    Glucose, Bld 112 (*)    Calcium 8.3 (*)    GFR calc non Af Amer 82 (*)    All other components within normal limits  URINE CULTURE  CULTURE, BLOOD (ROUTINE X 2)  CULTURE, BLOOD (ROUTINE X 2)  LACTIC ACID, PLASMA  URINALYSIS, ROUTINE W REFLEX MICROSCOPIC   Dg Chest 2 View  08/02/2012   *RADIOLOGY REPORT*  Clinical Data: Weakness  CHEST - 2 VIEW  Comparison: 07/27/2012  Findings: Small right pleural effusion.  Associated right  basilar opacity, atelectasis versus pneumonia.  Stable mild elevation of the right hemidiaphragm.  The heart is normal in size.  Visualized osseous structures are within normal limits.  IMPRESSION: Small right pleural effusion.  Associated right basilar opacity, atelectasis versus pneumonia.   Original Report Authenticated By: Charline Bills, M.D.   1. HCAP (healthcare-associated pneumonia)   2. Hypokalemia   3. Weakness   4. Leukocytosis, unspecified     MDM  79yo woman recently admitted for presumed UTI +/- early sepsis treated with CTX x1 and then PO Cipro, however urine cultures showed no growth. Now with CXR findings suggestive of PNA, will admit to treat with Zosyn/Vanc initially for HCAP given recent hospitalization. Also with hypokalemia, initial repletion in the ED. Have consulted hospitalist for admission, Dr. Brien Few, [facility] Team 8.  The above was discussed in its  entirety with attending ED physician Dr. Radford Pax.   Bobbye Morton, MD  PGY-2, [facility]  Bobbye Morton, MD 08/02/12 [phone]

## 2012-08-02 NOTE — ED Provider Notes (Signed)
I saw and evaluated the patient, reviewed the resident's note and I agree with the findings and plan.   .Face to face Exam:  General:  Awake HEENT:  Atraumatic Resp:  Normal effort Abd:  Nondistended Neuro:No focal weakness  Nelia Shi, MD 08/02/12 1728

## 2012-08-02 NOTE — Progress Notes (Signed)
ANTIBIOTIC CONSULT NOTE - INITIAL  Pharmacy Consult for Vancomycin, Zosyn Indication: pneumonia  Allergies  Allergen Reactions  . Aspirin Nausea And Vomiting    Patient Measurements:   Last documented weight 69 kg on 07/28/12  Vital Signs: Temp: 99.2 F (37.3 C) (07/20 1441) Temp src: Oral (07/20 1441) BP: 116/67 mmHg (07/20 1441) Pulse Rate: 106 (07/20 1441)  Labs:  Recent Labs  08/02/12 1550  WBC 19.3*  HGB 10.1*  PLT 302  CREATININE 0.61   The CrCl is unknown because both a height and weight (above a minimum accepted value) are required for this calculation.   Assessment: 58 yof admitted 7/20 with presumed UTI and pneumonia.  She was recently admitted and received one dose of Rocephin, then Cipro PO as an outpatient.  Pharmacy is asked to dose Vancomycin and Zosyn for HCAP.  SCr 0.61, CrCl ~ 60 ml/min (normalized and using age adjusted SCr 0.8)  WBC 19.3  Tm 99.2  Goal of Therapy:  Vancomycin trough level 15-20 mcg/ml  Plan:   Zosyn 3.375g IV Q8H infused over 4hrs.  Vancomycin 500mg  IV q12h.  Measure Vanc trough at steady state.  Follow up renal fxn and culture results.   Lynann Beaver PharmD, BCPS Pager [phone] 08/02/2012 4:53 PM

## 2012-08-02 NOTE — ED Notes (Signed)
Pt reports bila leg weakness, decreased appetite and dark colored urine.  Pt reports being recently discharged Tuesday, was admitted Monday for UTI.  Pt reports weakness in her legs but denies any falls.  Pt reports having 2 days left on her abx.

## 2012-08-02 NOTE — ED Notes (Signed)
Pt aware of the need for a urine sample. 

## 2012-08-02 NOTE — ED Notes (Signed)
Per Dr. Brien Few, TSH, B12, Folate, Iron and TIBC, and Ferritin can be drawn at 2000 with potassium. Phlebotmy and Lab made aware.

## 2012-08-02 NOTE — H&P (Signed)
PCP:   No PCP Per Patient   Chief Complaint:  Weakness, fever, poor appetite.   HPI: This is an 79 year old female, with known history of OA, anemia, hospitalized for culture-negative UTI 07/27/12-07/28/12 and subsequently discharged on Ciprofloxacin. According patient she has been compliant with her antibiotics, and has only 2 pills left. Apparently, since then, patient has become progressively weaker, with poor appetite. She denies subjective fever or chills, denies SOB or cough. Review of EMR reveals that blood cultures of 07/27/12. Grew microaerophilic streptococci in 1:3 bottles. CXR in the ED revealed a right basilar opacity. Wcc was 19.3, temp 99.2.     Allergies:   Allergies  Allergen Reactions  . Aspirin Nausea And Vomiting      Past Medical History  Diagnosis Date  . Arthritis     Past Surgical History  Procedure Laterality Date  . No past surgeries      Prior to Admission medications   Medication Sig Start Date End Date Taking? Authorizing Provider  ciprofloxacin (CIPRO) 500 MG tablet Take 1 tablet (500 mg total) by mouth 2 (two) times daily. 07/28/12  Yes Dorothea Ogle, MD    Social History: Patient reports that she quit smoking about 34 years ago. Her smoking use included Cigarettes. She smoked 0.00 packs per day for 20 years. She has never used smokeless tobacco. She reports that she does not drink alcohol or use illicit drugs.  Family History: Father died in his 105s, with heart problems. Mother died at age 79 years. She also had heart problems.   Review of Systems:  As per HPI and chief complaint. Patent denies weight loss, fever, chills, headache, blurred vision, difficulty in speaking, dysphagia, chest pain, cough, shortness of breath, orthopnea, paroxysmal nocturnal dyspnea, nausea, diaphoresis, abdominal pain, vomiting, diarrhea, belching, heartburn, hematemesis, melena, dysuria, nocturia, urinary frequency, hematochezia, lower extremity swelling, pain, or  redness. The rest of the systems review is negative.  Physical Exam:  General:  Patient does not appear to be in obvious acute distress. Alert, communicative, fully oriented, talking in complete sentences, not short of breath at rest.  HEENT:  Mild clinical pallor, no jaundice, no conjunctival injection or discharge. NECK:  Supple, JVP not seen, no carotid bruits, no palpable lymphadenopathy, no palpable goiter. CHEST:  Clinically clear to auscultation, no wheezes, no crackles. HEART:  Sounds 1 and 2 heard, normal, regular, no murmurs. ABDOMEN:  Full, soft, non-tender, no palpable organomegaly, no palpable masses, normal bowel sounds. GENITALIA:  Not examined. LOWER EXTREMITIES:  No pitting edema, palpable peripheral pulses. MUSCULOSKELETAL SYSTEM:  Generalized osteoarthritic changes, otherwise, normal. CENTRAL NERVOUS SYSTEM:  No focal neurologic deficit on gross examination.  Labs on Admission:  Results for orders placed during the hospital encounter of 08/02/12 (from the past 48 hour(s))  CBC WITH DIFFERENTIAL     Status: Abnormal   Collection Time    08/02/12  3:50 PM      Result Value Range   WBC 19.3 (*) 4.0 - 10.5 K/uL   RBC 3.57 (*) 3.87 - 5.11 MIL/uL   Hemoglobin 10.1 (*) 12.0 - 15.0 g/dL   HCT 16.1 (*) 09.6 - 04.5 %   MCV 77.9 (*) 78.0 - 100.0 fL   MCH 28.3  26.0 - 34.0 pg   MCHC 36.3 (*) 30.0 - 36.0 g/dL   RDW 40.9  81.1 - 91.4 %   Platelets 302  150 - 400 K/uL   Neutrophils Relative % 86 (*) 43 - 77 %  Neutro Abs 16.6 (*) 1.7 - 7.7 K/uL   Lymphocytes Relative 10 (*) 12 - 46 %   Lymphs Abs 1.9  0.7 - 4.0 K/uL   Monocytes Relative 4  3 - 12 %   Monocytes Absolute 0.8  0.1 - 1.0 K/uL   Eosinophils Relative 0  0 - 5 %   Eosinophils Absolute 0.0  0.0 - 0.7 K/uL   Basophils Relative 0  0 - 1 %   Basophils Absolute 0.0  0.0 - 0.1 K/uL  BASIC METABOLIC PANEL     Status: Abnormal   Collection Time    08/02/12  3:50 PM      Result Value Range   Sodium 136  135 - 145  mEq/L   Potassium 2.7 (*) 3.5 - 5.1 mEq/L   Comment: CRITICAL RESULT CALLED TO, READ BACK BY AND VERIFIED WITH:     JEFFERIES,L AT 1656 ON 161096 BY POTEAT,S   Chloride 98  96 - 112 mEq/L   CO2 27  19 - 32 mEq/L   Glucose, Bld 112 (*) 70 - 99 mg/dL   BUN 9  6 - 23 mg/dL   Creatinine, Ser 0.45  0.50 - 1.10 mg/dL   Calcium 8.3 (*) 8.4 - 10.5 mg/dL   GFR calc non Af Amer 82 (*) >90 mL/min   GFR calc Af Amer >90  >90 mL/min   Comment:            The eGFR has been calculated     using the CKD EPI equation.     This calculation has not been     validated in all clinical     situations.     eGFR's persistently     <90 mL/min signify     possible Chronic Kidney Disease.  LACTIC ACID, PLASMA     Status: None   Collection Time    08/02/12  3:57 PM      Result Value Range   Lactic Acid, Venous 1.6  0.5 - 2.2 mmol/L    Radiological Exams on Admission: Dg Chest 2 View  08/02/2012   *RADIOLOGY REPORT*  Clinical Data: Weakness  CHEST - 2 VIEW  Comparison: 07/27/2012  Findings: Small right pleural effusion.  Associated right basilar opacity, atelectasis versus pneumonia.  Stable mild elevation of the right hemidiaphragm.  The heart is normal in size.  Visualized osseous structures are within normal limits.  IMPRESSION: Small right pleural effusion.  Associated right basilar opacity, atelectasis versus pneumonia.   Original Report Authenticated By: Charline Bills, M.D.    Assessment/Plan Active Problems:   1. HAP (hospital-acquired pneumonia): Patient presented with progressive weakness, low grade pyrexia and FTT, about 5 days post-hospitalization for UTI, despite compliance with Ciprofloxacin therapy. Wcc is elevated at 19.3, with neutrophilia. CXR confirmed RLL pneumonia. This clinical scenario is consistent with HAP. Will admit and manage with iv vancomycin/Zosyn, as well as supportive treatment. Patient does not look toxic, however. .  2. Bacteremia: Review of EMR revealed that blood  cultures of 07/27/12, grew microarophilic streptococcus in 1:3 bottles. It is not clear what role this may have in current presentation. Above antibiotic choice should adequately cover this pathogen. Will repeat blood cultures.  3. Hypokalemia: Potassium is 2.7. Patient is on no culprit medication, so etiology is unclear. No doubt contributory to generalized weakness. Will replete as indicated, and check magnesium level, for completeness.  4. Anemia: This is mild, microcytic. Will check anemia panel and do FOBT. 5. Recent UTI: This  was diagnosed on 07/27/12, and was culture-negative . Patient has completed a total of 7 days antibiotics, for this.    Further management will depend on clinical course.   Comment: Patient is FULL CODE.   Time Spent on Admission: 1 Hour.   Gisselle Galvis,CHRISTOPHER 08/02/2012, 5:55 PM

## 2012-08-03 LAB — IRON AND TIBC: Saturation Ratios: 10 % — ABNORMAL LOW (ref 20–55)

## 2012-08-03 LAB — MAGNESIUM: Magnesium: 2 mg/dL (ref 1.5–2.5)

## 2012-08-03 LAB — FOLATE: Folate: 16.3 ng/mL

## 2012-08-03 LAB — VITAMIN B12: Vitamin B-12: 1079 pg/mL — ABNORMAL HIGH (ref 211–911)

## 2012-08-03 LAB — TSH: TSH: 1.214 u[IU]/mL (ref 0.350–4.500)

## 2012-08-03 LAB — COMPREHENSIVE METABOLIC PANEL
BUN: 6 mg/dL (ref 6–23)
CO2: 23 mEq/L (ref 19–32)
Calcium: 7.8 mg/dL — ABNORMAL LOW (ref 8.4–10.5)
Creatinine, Ser: 0.57 mg/dL (ref 0.50–1.10)
GFR calc Af Amer: >90 mL/min (ref >90)
GFR calc non Af Amer: 83 mL/min — ABNORMAL LOW (ref >90)
Glucose, Bld: 103 mg/dL — ABNORMAL HIGH (ref 70–99)
Total Protein: 5.4 g/dL — ABNORMAL LOW (ref 6.0–8.3)

## 2012-08-03 LAB — CBC
Hemoglobin: 9.4 g/dL — ABNORMAL LOW (ref 12.0–15.0)
MCH: 28.7 pg (ref 26.0–34.0)
MCHC: 36.7 g/dL — ABNORMAL HIGH (ref 30.0–36.0)
MCV: 78 fL (ref 78.0–100.0)
RBC: 3.28 MIL/uL — ABNORMAL LOW (ref 3.87–5.11)

## 2012-08-03 MED ORDER — VITAMINS A & D EX OINT
TOPICAL_OINTMENT | CUTANEOUS | Status: AC
  Administered 2012-08-03: 20:00:00
  Filled 2012-08-03: qty 5

## 2012-08-03 MED ORDER — METRONIDAZOLE 500 MG PO TABS
1000.0000 mg | ORAL_TABLET | Freq: Once | ORAL | Status: AC
  Administered 2012-08-03: 1000 mg via ORAL
  Filled 2012-08-03: qty 2

## 2012-08-03 MED ORDER — POTASSIUM CHLORIDE CRYS ER 20 MEQ PO TBCR
40.0000 meq | EXTENDED_RELEASE_TABLET | Freq: Once | ORAL | Status: AC
  Administered 2012-08-03: 40 meq via ORAL
  Filled 2012-08-03: qty 2

## 2012-08-03 NOTE — Evaluation (Addendum)
Physical Therapy Evaluation Patient Details Name: Sydney Scott MRN: [id number] DOB: Nov 10, 1927 Today's Date: 08/03/2012 Time: 2841-3244 PT Time Calculation (min): 12 min  PT Assessment / Plan / Recommendation History of Present Illness  This is an 79 year old female, with known history of OA, anemia, hospitalized for culture-negative UTI 07/27/12-07/28/12 and subsequently discharged on Ciprofloxacin. According patient she has been compliant with her antibiotics, and has only 2 pills left. Apparently, since then, patient has become progressively weaker, with poor appetite. She denies subjective fever or chills, denies SOB or cough.  Clinical Impression  On eval, pt required Supervision-Min guard assist for mobility-able to ambulate ~115 feet without assistive device. Anticipate pt will progress well during stay. No follow-up PT needs at this time. Will follow during stay. Encouraged pt to ambulate with nursing supervision as well.    PT Assessment  Patient needs continued PT services    Follow Up Recommendations  No PT follow up    Does the patient have the potential to tolerate intense rehabilitation      Barriers to Discharge        Equipment Recommendations  None recommended by PT    Recommendations for Other Services OT consult   Frequency Min 3X/week    Precautions / Restrictions Precautions Precautions: None Restrictions Weight Bearing Restrictions: No   Pertinent Vitals/Pain No c/o pain      Mobility  Bed Mobility Bed Mobility: Supine to Sit;Sit to Supine Supine to Sit: 6: Modified independent (Device/Increase time);HOB elevated Sit to Supine: 6: Modified independent (Device/Increase time);HOB elevated Transfers Transfers: Sit to Stand;Stand to Sit Sit to Stand: 5: Supervision;From bed Stand to Sit: 5: Supervision;To bed Ambulation/Gait Ambulation/Gait Assistance: 4: Min guard Ambulation Distance (Feet): 115 Feet Assistive device: None Ambulation/Gait  Assistance Details: O2 sats 96% HR 107 bpm during ambulation Gait Pattern: Wide base of support;Decreased stride length    Exercises     PT Diagnosis: Generalized weakness  PT Problem List: Decreased strength;Decreased activity tolerance;Decreased mobility PT Treatment Interventions: Gait training;Functional mobility training;Therapeutic activities;Therapeutic exercise;Patient/family education     PT Goals(Current goals can be found in the care plan section) Acute Rehab PT Goals Patient Stated Goal: regain independence. home PT Goal Formulation: With patient Time For Goal Achievement: 08/17/12 Potential to Achieve Goals: Good  Visit Information  Last PT Received On: 08/03/12 Assistance Needed: +1 History of Present Illness: This is an 79 year old female, with known history of OA, anemia, hospitalized for culture-negative UTI 07/27/12-07/28/12 and subsequently discharged on Ciprofloxacin. According patient she has been compliant with her antibiotics, and has only 2 pills left. Apparently, since then, patient has become progressively weaker, with poor appetite. She denies subjective fever or chills, denies SOB or cough.       Prior Functioning  Home Living Family/patient expects to be discharged to:: Private residence Living Arrangements: Alone Type of Home: House Home Access: Level entry Home Layout: One level Home Equipment: None Additional Comments: tub/shower-no grab bars, regular height toilet-no grab bars Prior Function Level of Independence: Independent Communication Communication: No difficulties    Cognition  Cognition Arousal/Alertness: Awake/alert Behavior During Therapy: WFL for tasks assessed/performed Overall Cognitive Status: Within Functional Limits for tasks assessed    Extremity/Trunk Assessment Upper Extremity Assessment Upper Extremity Assessment: Defer to OT evaluation Lower Extremity Assessment Lower Extremity Assessment: Generalized weakness Cervical  / Trunk Assessment Cervical / Trunk Assessment: Normal   Balance    End of Session PT - End of Session Equipment Utilized During Treatment: Gait belt  Activity Tolerance: Patient tolerated treatment well Patient left: in bed;with call bell/phone within reach  GP     Rebeca Alert, MPT Pager: [phone]

## 2012-08-03 NOTE — Progress Notes (Signed)
  Echocardiogram 2D Echocardiogram has been performed.  Jorje Guild 08/03/2012, 12:39 PM

## 2012-08-03 NOTE — Progress Notes (Signed)
Nutrition Brief Note  Patient identified on the Malnutrition Screening Tool (MST) Report  Body mass index is 26.25 kg/(m^2). Patient meets criteria for overweight based on current BMI.   Current diet order is regular, patient is consuming approximately 100% of meals at this time. Labs and medications reviewed. Pt with slightly low potassium, elevated Alk phos, AST, ALT, and bilirubin. Pt getting IV and oral potassium replacement. Pt with pneumonia. Met with pt who reports poor appetite for 3-4 days with pt consuming only 2 of her 3 usual meals. Pt reports she may have lost a few pounds recently, but not much. Pt reports eating well currently and denies any nutritional concerns.   No nutrition interventions warranted at this time. If nutrition issues arise, please consult RD.   Levon Hedger MS, RD, LDN [phone] Pager [phone] After Hours Pager

## 2012-08-03 NOTE — Progress Notes (Signed)
[facility] PROGRESS NOTE  Ayeza Therriault ZOX:[id number] DOB: 1927-08-13 DOA: 08/02/2012 PCP: No PCP Per Patient  Assessment/Plan: Active Problems:   HAP (hospital-acquired pneumonia)   Bacteremia    1. HAP (hospital-acquired pneumonia): Patient presented with progressive weakness, low grade pyrexia and FTT, about 5 days post-hospitalization for UTI, despite compliance with Ciprofloxacin therapy. Wcc was elevated at 19.3, with neutrophilia. CXR confirmed RLL pneumonia. This clinical scenario is consistent with HAP. Managing with iv Vancomycin/Zosyn now day# 2, as well as supportive treatment. Patient does not look toxic, however, although she did spike a temp of 101.4 overnight. 2. Bacteremia: Review of EMR revealed that blood cultures of 07/27/12, grew microarophilic streptococcus in 1:3 bottles. It is not clear what role this may have in current presentation. Above antibiotic choice should adequately cover this pathogen. Blood cultures are pending. In view of recrudescence of fever, will check 2D Echocardiogram, to evaluate for endocarditis.  3. Hypokalemia: Potassium was 2.7 on admission. Patient is on no culprit medication, so etiology is unclear. No doubt contributory to generalized weakness. Repleting as indicated, Magnesium level is normal. TSH is 1.214.  4. Anemia: This is mild, microcytic. Anemia panel revealed iron deficiency. FOBT is pending.  5. Recent UTI: This was diagnosed on 07/27/12, and was culture-negative . Patient has completed a total of 7 days antibiotics, for this. Repeat U/A has revealed persistent pyuria and bacteriuria. Adequately addressed with current antibiotic choice. Will await culture.  6. Trichomoniasis: This was an incidental finding on U/A. Addressed with 1g Flagyl PO today. Will check HIV test and RPR.    Code Status: Full Code.  Family Communication:  Disposition Plan: To be determined.    Brief narrative: This is an 79 year old female, with known  history of OA, anemia, hospitalized for culture-negative UTI 07/27/12-07/28/12 and subsequently discharged on Ciprofloxacin. According patient she has been compliant with her antibiotics, and has only 2 pills left. Apparently, since then, patient has become progressively weaker, with poor appetite. She denies subjective fever or chills, denies SOB or cough. Review of EMR reveals that blood cultures of 07/27/12. Grew microaerophilic streptococci in 1:3 bottles. CXR in the ED revealed a right basilar opacity. Wcc was 19.3, temp 99.2.    Consultants:  N/A.   Procedures:  CXR.   Antibiotics:  Vancomycin 08/02/12>>>  Zosyn 08/02/12>>>  HPI/Subjective: No new issues.   Objective: Vital signs in last 24 hours: Temp:  [99.2 F (37.3 C)-101.4 F (38.6 C)] 99.6 F (37.6 C) (07/21 0657) Pulse Rate:  [87-106] 87 (07/21 0657) Resp:  [20] 20 (07/21 0657) BP: (116-133)/(67-75) 120/69 mmHg (07/21 0657) SpO2:  [90 %-96 %] 90 % (07/21 0657) Weight:  [69.4 kg (153 lb)] 69.4 kg (153 lb) (07/20 2237) Weight change:  Last BM Date: 08/03/12  Intake/Output from previous day: 07/20 0701 - 07/21 0700 In: -  Out: 200 [Urine:200]     Physical Exam: General: Comfortable. Alert, communicative, fully oriented, talking in complete sentences, not short of breath at rest.  HEENT: Mild clinical pallor, no jaundice, no conjunctival injection or discharge.  NECK: Supple, JVP not seen, no carotid bruits, no palpable lymphadenopathy, no palpable goiter.  CHEST: Clinically clear to auscultation, no wheezes, no crackles.  HEART: Sounds 1 and 2 heard, normal, regular, no murmurs.  ABDOMEN: Full, soft, non-tender, no palpable organomegaly, no palpable masses, normal bowel sounds.  GENITALIA: Not examined.  LOWER EXTREMITIES: No pitting edema, palpable peripheral pulses.  MUSCULOSKELETAL SYSTEM: Generalized osteoarthritic changes, otherwise, normal.  CENTRAL NERVOUS SYSTEM:  No focal neurologic deficit on gross  examination.  Lab Results:  Recent Labs  08/02/12 1550 08/03/12 0508  WBC 19.3* 18.4*  HGB 10.1* 9.4*  HCT 27.8* 25.6*  PLT 302 257    Recent Labs  08/02/12 1550 08/02/12 2050 08/03/12 0508  NA 136  --  136  K 2.7* 3.1* 3.3*  CL 98  --  103  CO2 27  --  23  GLUCOSE 112*  --  103*  BUN 9  --  6  CREATININE 0.61  --  0.57  CALCIUM 8.3*  --  7.8*   Recent Results (from the past 240 hour(s))  CULTURE, BLOOD (ROUTINE X 2)     Status: None   Collection Time    07/27/12  9:50 AM      Result Value Range Status   Specimen Description BLOOD RT HAND   Final   Special Requests Normal BOTTLES DRAWN AEROBIC AND ANAEROBIC  4CC   Final   Culture  Setup Time 07/27/2012 15:07   Final   Culture     Final   Value: MICROAEROPHILIC STREPTOCOCCI     Note: Standardized susceptibility testing for this organism is not available.     Note: Gram Stain Report Called to,Read Back By and Verified With: COURTNEY K @1430  07/28/12 BY KRAWS   Report Status 07/30/2012 FINAL   Final  CULTURE, BLOOD (ROUTINE X 2)     Status: None   Collection Time    07/27/12 10:10 AM      Result Value Range Status   Specimen Description BLOOD LEFT HAND   Final   Special Requests Normal BOTTLES DRAWN AEROBIC ONLY  3CC   Final   Culture  Setup Time 07/27/2012 15:07   Final   Culture NO GROWTH 5 DAYS   Final   Report Status 08/02/2012 FINAL   Final  URINE CULTURE     Status: None   Collection Time    07/27/12 11:25 AM      Result Value Range Status   Specimen Description URINE, CATHETERIZED   Final   Special Requests Normal   Final   Culture  Setup Time 07/27/2012 15:30   Final   Colony Count NO GROWTH   Final   Culture NO GROWTH   Final   Report Status 07/28/2012 FINAL   Final  CULTURE, BLOOD (SINGLE)     Status: None   Collection Time    07/27/12  1:00 PM      Result Value Range Status   Specimen Description BLOOD R HAND   Final   Special Requests BOTTLES DRAWN AEROBIC AND ANAEROBIC 4.5 CC   Final    Culture  Setup Time 07/27/2012 15:07   Final   Culture NO GROWTH 5 DAYS   Final   Report Status 08/02/2012 FINAL   Final  URINE CULTURE     Status: None   Collection Time    07/27/12  7:37 PM      Result Value Range Status   Specimen Description URINE, RANDOM   Final   Special Requests NONE   Final   Culture  Setup Time 07/28/2012 00:42   Final   Colony Count NO GROWTH   Final   Culture NO GROWTH   Final   Report Status 07/28/2012 FINAL   Final     Studies/Results: Dg Chest 2 View  08/02/2012   *RADIOLOGY REPORT*  Clinical Data: Weakness  CHEST - 2 VIEW  Comparison: 07/27/2012  Findings: Small  right pleural effusion.  Associated right basilar opacity, atelectasis versus pneumonia.  Stable mild elevation of the right hemidiaphragm.  The heart is normal in size.  Visualized osseous structures are within normal limits.  IMPRESSION: Small right pleural effusion.  Associated right basilar opacity, atelectasis versus pneumonia.   Original Report Authenticated By: Charline Bills, M.D.    Medications: Scheduled Meds: . heparin  5,000 Units Subcutaneous Q8H  . piperacillin-tazobactam (ZOSYN)  IV  3.375 g Intravenous Q8H  . sodium chloride  3 mL Intravenous Q12H  . vancomycin  500 mg Intravenous Q12H   Continuous Infusions: . sodium chloride 50 mL/hr at 08/02/12 1822   PRN Meds:.sodium chloride, acetaminophen, acetaminophen, albuterol, morphine injection, ondansetron (ZOFRAN) IV, ondansetron, oxyCODONE, sodium chloride    LOS: 1 day   Weaver Tweed,CHRISTOPHER  [facility] Pager [phone]. If 8PM-8AM, please contact night-coverage at [email or web address], password [facility] 08/03/2012, 8:55 AM  LOS: 1 day

## 2012-08-03 NOTE — Progress Notes (Signed)
I agree with the assessment. 

## 2012-08-03 NOTE — Evaluation (Signed)
Occupational Therapy Evaluation Patient Details Name: Sydney Scott MRN: [id number] DOB: 1927/05/24 Today's Date: 08/03/2012 Time: 4098-1191 OT Time Calculation (min): 15 min  OT Assessment / Plan / Recommendation History of present illness This is an 79 year old female, with known history of OA, anemia, hospitalized for culture-negative UTI 07/27/12-07/28/12 and subsequently discharged on Ciprofloxacin. According patient she has been compliant with her antibiotics, and has only 2 pills left. Apparently, since then, patient has become progressively weaker, with poor appetite. She denies subjective fever or chills, denies SOB or cough.   Clinical Impression   Pt is overall at min guard assist level for ADL. Began education on tub DME and pt to decide if she would like a seat for the tub. She states she has had concerns about getting out of the tub at home and usually sit down in the bottom of the tub to soak. Discussed safety benefits of tubseat and educated on energy conservation. Will benefit from likely just one more session if still here after today.    OT Assessment  Patient needs continued OT Services    Follow Up Recommendations  No OT follow up    Barriers to Discharge      Equipment Recommendations  Tub/shower seat (if pt decides she would like one)    Recommendations for Other Services    Frequency  Min 2X/week    Precautions / Restrictions Precautions Precautions: None Restrictions Weight Bearing Restrictions: No   Pertinent Vitals/Pain 96% RA with activity    ADL  Eating/Feeding: Simulated;Independent Grooming: Performed;Min guard Where Assessed - Grooming: Unsupported standing Upper Body Bathing: Simulated;Chest;Right arm;Left arm;Abdomen;Set up Where Assessed - Upper Body Bathing: Unsupported sitting Lower Body Bathing: Simulated;Min guard Where Assessed - Lower Body Bathing: Supported sit to stand Upper Body Dressing: Simulated;Set up Where Assessed - Upper Body  Dressing: Unsupported sitting Lower Body Dressing: Simulated;Min guard Where Assessed - Lower Body Dressing: Supported sit to stand Toilet Transfer: Performed;Min guard Statistician Method: Other (comment) (no device into bathroom) Acupuncturist: Comfort height toilet;Grab bars Toileting - Clothing Manipulation and Hygiene: Simulated;Min guard Where Assessed - Toileting Clothing Manipulation and Hygiene: Sit to stand from 3-in-1 or toilet ADL Comments: Discussed use of a tubseat for home. She normally gets down into tub to soak but states she does have concerns about falling trying to get back out of tub. Pt interested in a tubseat but will think about it. Explained that if pt is interested and decides to get one, just let case manager or nursing know and a seat can be ordered. Cautioned pt to ease back into activity and take plenty of rest breaks when she returns home.     OT Diagnosis: Generalized weakness  OT Problem List: Decreased strength OT Treatment Interventions: Self-care/ADL training;DME and/or AE instruction;Therapeutic activities   OT Goals(Current goals can be found in the care plan section) Acute Rehab OT Goals Patient Stated Goal: regain independence. home OT Goal Formulation: With patient Time For Goal Achievement: 08/17/12 Potential to Achieve Goals: Good ADL Goals Pt Will Transfer to Toilet: with modified independence;regular height toilet;ambulating Pt Will Perform Toileting - Clothing Manipulation and hygiene: with modified independence;sit to/from stand Pt Will Perform Tub/Shower Transfer: Tub transfer;with modified independence;shower seat Additional ADL Goal #1: pt will gather all items for B/D with independence.   Visit Information  Last OT Received On: 08/03/12 Assistance Needed: +1 PT/OT Co-Evaluation/Treatment: Yes History of Present Illness: This is an 79 year old female, with known history of OA,  anemia, hospitalized for culture-negative UTI  07/27/12-07/28/12 and subsequently discharged on Ciprofloxacin. According patient she has been compliant with her antibiotics, and has only 2 pills left. Apparently, since then, patient has become progressively weaker, with poor appetite. She denies subjective fever or chills, denies SOB or cough.       Prior Functioning     Home Living Family/patient expects to be discharged to:: Private residence Living Arrangements: Alone Type of Home: House Home Access: Level entry Home Layout: One level Home Equipment: None Additional Comments: tub/shower-no grab bars, regular height toilet-no grab bars Prior Function Level of Independence: Independent (states family member runs errands for her; she doesnt drive) Communication Communication: No difficulties         Vision/Perception     Copywriter, advertising Arousal/Alertness: Awake/alert Behavior During Therapy: WFL for tasks assessed/performed Overall Cognitive Status: Within Functional Limits for tasks assessed    Extremity/Trunk Assessment Upper Extremity Assessment Upper Extremity Assessment: Overall WFL for tasks assessed Lower Extremity Assessment Lower Extremity Assessment: Generalized weakness Cervical / Trunk Assessment Cervical / Trunk Assessment: Normal     Mobility Bed Mobility Bed Mobility: Supine to Sit;Sit to Supine Supine to Sit: 6: Modified independent (Device/Increase time);HOB elevated Sit to Supine: 6: Modified independent (Device/Increase time);HOB elevated Transfers Sit to Stand: 5: Supervision;From bed Stand to Sit: 5: Supervision;To bed     Exercise     Balance     End of Session OT - End of Session Activity Tolerance: Patient tolerated treatment well Patient left: in bed  GO     Lennox Laity [phone] 08/03/2012, 12:51 PM

## 2012-08-04 ENCOUNTER — Inpatient Hospital Stay (HOSPITAL_COMMUNITY): Payer: Medicare Other

## 2012-08-04 DIAGNOSIS — R627 Adult failure to thrive: Secondary | ICD-10-CM

## 2012-08-04 DIAGNOSIS — R509 Fever, unspecified: Secondary | ICD-10-CM

## 2012-08-04 DIAGNOSIS — N390 Urinary tract infection, site not specified: Secondary | ICD-10-CM

## 2012-08-04 LAB — URINE CULTURE: Colony Count: NO GROWTH

## 2012-08-04 LAB — CBC
HCT: 24.8 % — ABNORMAL LOW (ref 36.0–46.0)
MCH: 28.5 pg (ref 26.0–34.0)
MCV: 77.7 fL — ABNORMAL LOW (ref 78.0–100.0)
RDW: 14.3 % (ref 11.5–15.5)
WBC: 19.5 10*3/uL — ABNORMAL HIGH (ref 4.0–10.5)

## 2012-08-04 LAB — BASIC METABOLIC PANEL
BUN: 5 mg/dL — ABNORMAL LOW (ref 6–23)
Calcium: 7.9 mg/dL — ABNORMAL LOW (ref 8.4–10.5)
Chloride: 103 mEq/L (ref 96–112)
Creatinine, Ser: 0.57 mg/dL (ref 0.50–1.10)
GFR calc Af Amer: >90 mL/min (ref >90)

## 2012-08-04 LAB — VANCOMYCIN, TROUGH: Vancomycin Tr: <5 ug/mL — ABNORMAL LOW (ref 10.0–20.0)

## 2012-08-04 MED ORDER — POTASSIUM CHLORIDE CRYS ER 20 MEQ PO TBCR
40.0000 meq | EXTENDED_RELEASE_TABLET | Freq: Once | ORAL | Status: AC
  Administered 2012-08-04: 40 meq via ORAL
  Filled 2012-08-04: qty 2

## 2012-08-04 MED ORDER — VANCOMYCIN HCL IN DEXTROSE 1-5 GM/200ML-% IV SOLN
1000.0000 mg | Freq: Two times a day (BID) | INTRAVENOUS | Status: DC
  Filled 2012-08-04: qty 200

## 2012-08-04 NOTE — Progress Notes (Signed)
ANTIBIOTIC CONSULT NOTE - Follow-up  Pharmacy Consult for Vancomycin, Zosyn Indication: pneumonia  Allergies  Allergen Reactions  . Aspirin Nausea And Vomiting    Patient Measurements: Height: 5\' 4"  (162.6 cm) Weight: 153 lb (69.4 kg) IBW/kg (Calculated) : 54.7 Last documented weight 69 kg on 07/28/12  Vital Signs: Temp: 100.3 F (37.9 C) (07/22 0533) Temp src: Oral (07/22 0533) BP: 126/61 mmHg (07/22 0533) Pulse Rate: 84 (07/22 0533)  Labs:  Recent Labs  08/02/12 1550 08/03/12 0508 08/04/12 0445  WBC 19.3* 18.4* 19.5*  HGB 10.1* 9.4* 9.1*  PLT 302 257 345  CREATININE 0.61 0.57 0.57   Estimated Creatinine Clearance: 51 ml/min (by C-G formula based on Cr of 0.57).   Assessment: 65 yof admitted 7/20 with presumed UTI and pneumonia.  She was recently admitted and received one dose of Rocephin, then Cipro PO as an outpatient.  Pharmacy is asked to dose Vancomycin and Zosyn for HCAP.  (Rocephin x1 7/14, then Cipro 7/15 - 7/20) 7/20 >> Vanc >> 7/20 >> Zosyn >>  7/21 >> Flagyl >> 7/21  Tmax: 100.3 WBCs: 19.5 (unchanged) Renal: SCr 0.57, stable, CrCl ~ 51 CG, 60 N   7/14 blood x3: microaerophilic strep x 1 bottle 7/20 blood x2: NGTD 7/20 urine: NG 7/20 U/A: (+), and trichomonas present  Dose changes/levels 6/22: vancomycin trough: ___ Mcg/ml on 500mg  IV q12h (prior to 5th dose)   Goal of Therapy:  Vancomycin trough level 15-20 mcg/ml  Plan:  Day #3 vancomycin/zosyn  Continue Zosyn 3.375g IV Q8H infused over 4hrs.  Currently on Vancomycin 500mg  IV q12h - check trough tonight.  Juliette Alcide, PharmD, BCPS.   Pager: [phone] 08/04/2012 1:19 PM

## 2012-08-04 NOTE — Progress Notes (Signed)
[facility] PROGRESS NOTE  Ferrell Flam ZOX:[id number] DOB: [birth year] DOA: 08/02/2012 PCP: No PCP Per Patient  Assessment/Plan: Active Problems:   HAP (hospital-acquired pneumonia)   Bacteremia    1. HAP (hospital-acquired pneumonia): Patient presented with progressive weakness, low grade pyrexia and FTT, about 5 days post-hospitalization for UTI, despite compliance with Ciprofloxacin therapy. Wcc was elevated at 19.3, with neutrophilia. CXR confirmed RLL pneumonia. This clinical scenario is consistent with HAP. Managing with iv Vancomycin/Zosyn now day# 3, as well as supportive treatment. Patient does not look toxic, however, although she did spike a temp of 101.4 overnight on 08/03/12. Today, temperature is low grade, and patient feels clinically better. We shall repeat CXR today. 2. Bacteremia: Review of EMR revealed that blood cultures of 07/27/12, grew microarophilic streptococcus in 1:3 bottles. It is not clear what role this may have in current presentation. Above antibiotic choice should adequately cover this pathogen. Repeat blood cultures are negative so far. In view of recrudescence of fever have checked 2D Echocardiogram, and this showed normal LV cavity size, mild concentric hypertrophy, EF of 55% to 60% and no regional wall motion abnormalities. The atrium was mildly dilated and valve were sclerotic. Will request ID input.  3. Hypokalemia: Potassium was 2.7 on admission. Patient is on no culprit medication, so etiology is unclear. No doubt contributory to generalized weakness. Repleting as indicated, Magnesium level is normal. TSH is 1.214.  4. Anemia: This is mild, microcytic. Anemia panel revealed iron deficiency. FOBT is pending.  5. Recent UTI: This was diagnosed on 07/27/12, and was culture-negative . Patient has completed a total of 7 days antibiotics, for this. Repeat U/A has revealed persistent pyuria and bacteriuria. Adequately addressed with current antibiotic choice.  Urine culture is negative so far.  6. Trichomoniasis: This was an incidental finding on U/A. Addressed with 2g Flagyl PO on 08/03/12. HIV test and RPR are negative.    Code Status: Full Code.  Family Communication:  Disposition Plan: To be determined.    Brief narrative: This is an 79 year old female, with known history of OA, anemia, hospitalized for culture-negative UTI 07/27/12-07/28/12 and subsequently discharged on Ciprofloxacin. According patient she has been compliant with her antibiotics, and has only 2 pills left. Apparently, since then, patient has become progressively weaker, with poor appetite. She denies subjective fever or chills, denies SOB or cough. Review of EMR reveals that blood cultures of 07/27/12. Grew microaerophilic streptococci in 1:3 bottles. CXR in the ED revealed a right basilar opacity. Wcc was 19.3, temp 99.2.    Consultants:  N/A.   Procedures:  CXR.   2D Echocardiogram.   Antibiotics:  Vancomycin 08/02/12>>>  Zosyn 08/02/12>>>  HPI/Subjective: Feels much better.   Objective: Vital signs in last 24 hours: Temp:  [100.2 F (37.9 C)-100.3 F (37.9 C)] 100.3 F (37.9 C) (07/22 0533) Pulse Rate:  [84-97] 84 (07/22 0533) Resp:  [20] 20 (07/22 0533) BP: (108-126)/(60-61) 126/61 mmHg (07/22 0533) SpO2:  [96 %-97 %] 96 % (07/22 0533) Weight change:  Last BM Date: 08/04/12  Intake/Output from previous day: 07/21 0701 - 07/22 0700 In: 1964.2 [P.O.:600; I.V.:1164.2; IV Piggyback:200] Out: -      Physical Exam: General: Comfortable. Alert, communicative, fully oriented, talking in complete sentences, not short of breath at rest.  HEENT: Mild clinical pallor, no jaundice, no conjunctival injection or discharge.  NECK: Supple, JVP not seen, no carotid bruits, no palpable lymphadenopathy, no palpable goiter.  CHEST: Clinically clear to auscultation, no wheezes, no crackles.  HEART: Sounds 1 and 2 heard, normal, regular, no murmurs.  ABDOMEN: Full,  soft, non-tender, no palpable organomegaly, no palpable masses, normal bowel sounds.  GENITALIA: Not examined.  LOWER EXTREMITIES: No pitting edema, palpable peripheral pulses.  MUSCULOSKELETAL SYSTEM: Generalized osteoarthritic changes, otherwise, normal.  CENTRAL NERVOUS SYSTEM: No focal neurologic deficit on gross examination.  Lab Results:  Recent Labs  08/03/12 0508 08/04/12 0445  WBC 18.4* 19.5*  HGB 9.4* 9.1*  HCT 25.6* 24.8*  PLT 257 345    Recent Labs  08/03/12 0508 08/04/12 0445  NA 136 137  K 3.3* 3.3*  CL 103 103  CO2 23 24  GLUCOSE 103* 116*  BUN 6 5*  CREATININE 0.57 0.57  CALCIUM 7.8* 7.9*   Recent Results (from the past 240 hour(s))  CULTURE, BLOOD (ROUTINE X 2)     Status: None   Collection Time    07/27/12  9:50 AM      Result Value Range Status   Specimen Description BLOOD RT HAND   Final   Special Requests Normal BOTTLES DRAWN AEROBIC AND ANAEROBIC  4CC   Final   Culture  Setup Time 07/27/2012 15:07   Final   Culture     Final   Value: MICROAEROPHILIC STREPTOCOCCI     Note: Standardized susceptibility testing for this organism is not available.     Note: Gram Stain Report Called to,Read Back By and Verified With: COURTNEY K @1430  07/28/12 BY KRAWS   Report Status 07/30/2012 FINAL   Final  CULTURE, BLOOD (ROUTINE X 2)     Status: None   Collection Time    07/27/12 10:10 AM      Result Value Range Status   Specimen Description BLOOD LEFT HAND   Final   Special Requests Normal BOTTLES DRAWN AEROBIC ONLY  3CC   Final   Culture  Setup Time 07/27/2012 15:07   Final   Culture NO GROWTH 5 DAYS   Final   Report Status 08/02/2012 FINAL   Final  URINE CULTURE     Status: None   Collection Time    07/27/12 11:25 AM      Result Value Range Status   Specimen Description URINE, CATHETERIZED   Final   Special Requests Normal   Final   Culture  Setup Time 07/27/2012 15:30   Final   Colony Count NO GROWTH   Final   Culture NO GROWTH   Final   Report  Status 07/28/2012 FINAL   Final  CULTURE, BLOOD (SINGLE)     Status: None   Collection Time    07/27/12  1:00 PM      Result Value Range Status   Specimen Description BLOOD R HAND   Final   Special Requests BOTTLES DRAWN AEROBIC AND ANAEROBIC 4.5 CC   Final   Culture  Setup Time 07/27/2012 15:07   Final   Culture NO GROWTH 5 DAYS   Final   Report Status 08/02/2012 FINAL   Final  URINE CULTURE     Status: None   Collection Time    07/27/12  7:37 PM      Result Value Range Status   Specimen Description URINE, RANDOM   Final   Special Requests NONE   Final   Culture  Setup Time 07/28/2012 00:42   Final   Colony Count NO GROWTH   Final   Culture NO GROWTH   Final   Report Status 07/28/2012 FINAL   Final  CULTURE, BLOOD (ROUTINE X  2)     Status: None   Collection Time    08/02/12  4:40 PM      Result Value Range Status   Specimen Description BLOOD RIGHT HAND   Final   Special Requests NONE   Final   Culture  Setup Time 08/02/2012 20:54   Final   Culture     Final   Value:        BLOOD CULTURE RECEIVED NO GROWTH TO DATE CULTURE WILL BE HELD FOR 5 DAYS BEFORE ISSUING A FINAL NEGATIVE REPORT   Report Status PENDING   Incomplete  CULTURE, BLOOD (ROUTINE X 2)     Status: None   Collection Time    08/02/12  5:34 PM      Result Value Range Status   Specimen Description BLOOD LEFT ANTECUBITAL   Final   Special Requests BOTTLES DRAWN AEROBIC AND ANAEROBIC 4CC   Final   Culture  Setup Time 08/02/2012 20:47   Final   Culture     Final   Value:        BLOOD CULTURE RECEIVED NO GROWTH TO DATE CULTURE WILL BE HELD FOR 5 DAYS BEFORE ISSUING A FINAL NEGATIVE REPORT   Report Status PENDING   Incomplete  URINE CULTURE     Status: None   Collection Time    08/02/12  6:41 PM      Result Value Range Status   Specimen Description URINE, CLEAN CATCH   Final   Special Requests NONE   Final   Culture  Setup Time 08/03/2012 03:38   Final   Colony Count NO GROWTH   Final   Culture NO GROWTH   Final    Report Status 08/04/2012 FINAL   Final     Studies/Results: Dg Chest 2 View  08/02/2012   *RADIOLOGY REPORT*  Clinical Data: Weakness  CHEST - 2 VIEW  Comparison: 07/27/2012  Findings: Small right pleural effusion.  Associated right basilar opacity, atelectasis versus pneumonia.  Stable mild elevation of the right hemidiaphragm.  The heart is normal in size.  Visualized osseous structures are within normal limits.  IMPRESSION: Small right pleural effusion.  Associated right basilar opacity, atelectasis versus pneumonia.   Original Report Authenticated By: Charline Bills, M.D.    Medications: Scheduled Meds: . heparin  5,000 Units Subcutaneous Q8H  . piperacillin-tazobactam (ZOSYN)  IV  3.375 g Intravenous Q8H  . potassium chloride  40 mEq Oral Once  . sodium chloride  3 mL Intravenous Q12H  . vancomycin  500 mg Intravenous Q12H   Continuous Infusions: . sodium chloride 50 mL/hr at 08/02/12 1822   PRN Meds:.sodium chloride, acetaminophen, acetaminophen, albuterol, morphine injection, ondansetron (ZOFRAN) IV, ondansetron, oxyCODONE, sodium chloride    LOS: 2 days   Lakeena Downie,CHRISTOPHER  [facility] Pager [phone]. If 8PM-8AM, please contact night-coverage at [email or web address], password [facility] 08/04/2012, 11:34 AM  LOS: 2 days

## 2012-08-04 NOTE — Progress Notes (Signed)
  Pharmacy Note (Brief) - IV Vanco  79 yo F on Day #3 Vanco 500mg  IV q12h and Zosyn EI for PNA and UTI. For full details, please refer to previous pharmacist note from earlier today. Vanco trough is undetectable (<5). Will increase Vanco to 1g IV q12h and recheck trough at new steady state.  Darrol Angel, PharmD Pager: [phone] 08/04/2012 7:18 PM

## 2012-08-04 NOTE — Progress Notes (Signed)
Occupational Therapy Treatment Patient Details Name: Sydney Scott MRN: [id number] DOB: [birth year] Today's Date: 08/04/2012 Time: 3086-5784 OT Time Calculation (min): 9 min  OT Assessment / Plan / Recommendation  History of present illness This is an 79 year old female, with known history of OA, anemia, hospitalized for culture-negative UTI 07/27/12-07/28/12 and subsequently discharged on Ciprofloxacin. According patient she has been compliant with her antibiotics, and has only 2 pills left. Apparently, since then, patient has become progressively weaker, with poor appetite. She denies subjective fever or chills, denies SOB or cough.       OT comments  Practiced tub transfer in prep for d/c home. Discussed DME recommendations and energy conservation techniques. Pt doing well.   Follow Up Recommendations  No OT follow up    Barriers to Discharge       Equipment Recommendations  None recommended by OT (pt reports she will obtain on her own)    Recommendations for Other Services    Frequency Min 2X/week   Progress towards OT Goals Progress towards OT goals: Progressing toward goals  Plan Discharge plan remains appropriate    Precautions / Restrictions Precautions Precautions: None Restrictions Weight Bearing Restrictions: No   Pertinent Vitals/Pain No complaint of    ADL  Toilet Transfer: Performed;Supervision/safety Toilet Transfer Method: Other (comment) (no device into/out of bathroom) Toilet Transfer Equipment: Comfort height toilet;Grab bars (has vanity at home) Toileting - Architect and Hygiene: Simulated;Supervision/safety Where Assessed - Toileting Clothing Manipulation and Hygiene: Sit to stand from 3-in-1 or toilet Tub/Shower Transfer: Simulated;Min guard (hold to wall and side step over) ADL Comments: Discussed tubseat option with pt again. She is interested in getting a tub seat but wants to obtain one on her own. She agrees this would be a good safety  recommendation. Practiced simulated side step into tub holding door frame and educated on holding/leaning into shower wall as she steps over at home for support. Educated on taking her time and easing back into activity. Pt verbalized understanding of all.    OT Diagnosis:    OT Problem List:   OT Treatment Interventions:     OT Goals(current goals can now be found in the care plan section)    Visit Information  Last OT Received On: 08/04/12 Assistance Needed: +1 History of Present Illness: This is an 79 year old female, with known history of OA, anemia, hospitalized for culture-negative UTI 07/27/12-07/28/12 and subsequently discharged on Ciprofloxacin. According patient she has been compliant with her antibiotics, and has only 2 pills left. Apparently, since then, patient has become progressively weaker, with poor appetite. She denies subjective fever or chills, denies SOB or cough.    Subjective Data      Prior Functioning       Cognition  Cognition Arousal/Alertness: Awake/alert Behavior During Therapy: WFL for tasks assessed/performed Overall Cognitive Status: Within Functional Limits for tasks assessed    Mobility  Bed Mobility Supine to Sit: 6: Modified independent (Device/Increase time) Sit to Supine: 6: Modified independent (Device/Increase time) Transfers Transfers: Sit to Stand;Stand to Sit Sit to Stand: 6: Modified independent (Device/Increase time);From bed;From toilet;With upper extremity assist Stand to Sit: 6: Modified independent (Device/Increase time);To toilet;To bed;With upper extremity assist    Exercises      Balance     End of Session OT - End of Session Activity Tolerance: Patient tolerated treatment well Patient left: in bed;with call bell/phone within reach  GO     Lennox Laity  [phone] 08/04/2012, 9:50 AM

## 2012-08-04 NOTE — Consult Note (Signed)
[facility]  Total days of antibiotics 3        Day 3 piptazo        Day 3 vanco               Reason for Consult: fever, failure to thrive    Referring Physician: oti  Active Problems:   HAP (hospital-acquired pneumonia)   Bacteremia    HPI: Sydney Scott is a 79 y.o. female recent admission on 7/14 for uti given 7 day course of levofloxacin ( + UA, but urine cx negative, did have 1 of 4 bottles microaerophilic strep thought to be contaminant). Represents to the ED 7/19, with intermittent fevers, failure to thrive. She had fever of 101 in ED. Labs reveal leukocytosis of 19.5 with left shift of 86%N, cxr shows possible RLL infiltrate, and UA shows pyuria but has numerous squamous cells. Blood and urine cultures are showing no growth. She was empirically started on vancomycin and piptazo. Fever curve trending down since admission. She denies any pulmonary symptoms, no dysuria or abdominal pain. Did have constipation but resolved with ingesting goat's milk. She is feeling significantly better < 24hr of admit. She reports ambulating in the halls, sitting the chair. Feeling much improved.  Past Medical History  Diagnosis Date  . Arthritis     Allergies:  Allergies  Allergen Reactions  . Aspirin Nausea And Vomiting     MEDICATIONS: . heparin  5,000 Units Subcutaneous Q8H  . piperacillin-tazobactam (ZOSYN)  IV  3.375 g Intravenous Q8H  . sodium chloride  3 mL Intravenous Q12H  . vancomycin  500 mg Intravenous Q12H    History  Substance Use Topics  . Smoking status: Former Smoker -- 20 years    Types: Cigarettes    Quit date: 07/28/1978  . Smokeless tobacco: Never Used  . Alcohol Use: No    History reviewed. No pertinent family history.  Review of Systems  Constitutional: positive for fever, chills, diaphoresis,  Malaise and fatigue. But now no activity change, appetite change, fatigue and unexpected weight change.  HENT: Negative for  congestion, sore throat, rhinorrhea, sneezing, trouble swallowing and sinus pressure.  Eyes: Negative for photophobia and visual disturbance.  Respiratory: Negative for cough, chest tightness, shortness of breath, wheezing and stridor.  Cardiovascular: Negative for chest pain, palpitations and leg swelling.  Gastrointestinal: Negative for nausea, vomiting, abdominal pain, diarrhea, constipation, blood in stool, abdominal distention and anal bleeding.  Genitourinary: Negative for dysuria, hematuria, flank pain and difficulty urinating.  Musculoskeletal: Negative for myalgias, back pain, joint swelling, arthralgias and gait problem.  Skin: Negative for color change, pallor, rash and wound.  Neurological: Negative for dizziness, tremors, weakness and light-headedness.  Hematological: Negative for adenopathy. Does not bruise/bleed easily.  Psychiatric/Behavioral: Negative for behavioral problems, confusion, sleep disturbance, dysphoric mood, decreased concentration and agitation.     OBJECTIVE: Temp:  [99.7 F (37.6 C)-100.3 F (37.9 C)] 99.7 F (37.6 C) (07/22 1409) Pulse Rate:  [84-97] 93 (07/22 1409) Resp:  [16-20] 16 (07/22 1409) BP: (108-129)/(60-68) 129/68 mmHg (07/22 1409) SpO2:  [94 %-97 %] 94 % (07/22 1409)  Physical Exam  Constitutional:  oriented to person, place, and time.  appears well-developed and well-nourished. No distress.  HENT:  Mouth/Throat: Oropharynx is clear and moist. No oropharyngeal exudate. Poor dentition. No thrush Cardiovascular: Normal rate, regular rhythm and normal heart sounds. 2/6 soft blowing murmur BH LUSB Exam reveals no gallop and no friction rub.  Pulmonary/Chest: Effort normal and  breath sounds normal. No respiratory distress. He has no wheezes.  Abdominal: Soft. Bowel sounds are normal. exhibits no distension. There is no tenderness.  Lymphadenopathy:  no cervical adenopathy.  Neurological:  alert and oriented to person, place, and time.  Skin:  Skin is warm and dry. No rash noted. No erythema.  Psychiatric:  a normal mood and affect. His behavior is normal.    LABS: Results for orders placed during the hospital encounter of 08/02/12 (from the past 48 hour(s))  CULTURE, BLOOD (ROUTINE X 2)     Status: None   Collection Time    08/02/12  5:34 PM      Result Value Range   Specimen Description BLOOD LEFT ANTECUBITAL     Special Requests BOTTLES DRAWN AEROBIC AND ANAEROBIC 4CC     Culture  Setup Time 08/02/2012 20:47     Culture       Value:        BLOOD CULTURE RECEIVED NO GROWTH TO DATE CULTURE WILL BE HELD FOR 5 DAYS BEFORE ISSUING A FINAL NEGATIVE REPORT   Report Status PENDING    URINALYSIS, ROUTINE W REFLEX MICROSCOPIC     Status: Abnormal   Collection Time    08/02/12  6:40 PM      Result Value Range   Color, Urine AMBER (*) YELLOW   Comment: BIOCHEMICALS MAY BE AFFECTED BY COLOR   APPearance CLOUDY (*) CLEAR   Specific Gravity, Urine 1.017  1.005 - 1.030   pH 6.0  5.0 - 8.0   Glucose, UA NEGATIVE  NEGATIVE mg/dL   Hgb urine dipstick TRACE (*) NEGATIVE   Bilirubin Urine SMALL (*) NEGATIVE   Ketones, ur 15 (*) NEGATIVE mg/dL   Protein, ur 30 (*) NEGATIVE mg/dL   Urobilinogen, UA 1.0  0.0 - 1.0 mg/dL   Nitrite NEGATIVE  NEGATIVE   Leukocytes, UA LARGE (*) NEGATIVE  URINE MICROSCOPIC-ADD ON     Status: Abnormal   Collection Time    08/02/12  6:40 PM      Result Value Range   Squamous Epithelial / LPF MANY (*) RARE   WBC, UA TOO NUMEROUS TO COUNT  <3 WBC/hpf   RBC / HPF 0-2  <3 RBC/hpf   Bacteria, UA FEW (*) RARE   Urine-Other TRICHOMONAS PRESENT    URINE CULTURE     Status: None   Collection Time    08/02/12  6:41 PM      Result Value Range   Specimen Description URINE, CLEAN CATCH     Special Requests NONE     Culture  Setup Time 08/03/2012 03:38     Colony Count NO GROWTH     Culture NO GROWTH     Report Status 08/04/2012 FINAL    TSH     Status: None   Collection Time    08/02/12  8:50 PM      Result  Value Range   TSH 1.214  0.350 - 4.500 uIU/mL  VITAMIN B12     Status: Abnormal   Collection Time    08/02/12  8:50 PM      Result Value Range   Vitamin B-12 1079 (*) 211 - 911 pg/mL  FOLATE     Status: None   Collection Time    08/02/12  8:50 PM      Result Value Range   Folate 16.3     Comment: (NOTE)     Reference Ranges  Deficient:       0.4 - 3.3 ng/mL            Indeterminate:   3.4 - 5.4 ng/mL            Normal:              > 5.4 ng/mL  IRON AND TIBC     Status: Abnormal   Collection Time    08/02/12  8:50 PM      Result Value Range   Iron 20 (*) 42 - 135 ug/dL   TIBC 161 (*) 096 - 045 ug/dL   Saturation Ratios 10 (*) 20 - 55 %   UIBC 172  125 - 400 ug/dL  FERRITIN     Status: Abnormal   Collection Time    08/02/12  8:50 PM      Result Value Range   Ferritin 457 (*) 10 - 291 ng/mL  POTASSIUM     Status: Abnormal   Collection Time    08/02/12  8:50 PM      Result Value Range   Potassium 3.1 (*) 3.5 - 5.1 mEq/L  CBC     Status: Abnormal   Collection Time    08/03/12  5:08 AM      Result Value Range   WBC 18.4 (*) 4.0 - 10.5 K/uL   RBC 3.28 (*) 3.87 - 5.11 MIL/uL   Hemoglobin 9.4 (*) 12.0 - 15.0 g/dL   HCT 40.9 (*) 81.1 - 91.4 %   MCV 78.0  78.0 - 100.0 fL   MCH 28.7  26.0 - 34.0 pg   MCHC 36.7 (*) 30.0 - 36.0 g/dL   RDW 78.2  95.6 - 21.3 %   Platelets 257  150 - 400 K/uL  COMPREHENSIVE METABOLIC PANEL     Status: Abnormal   Collection Time    08/03/12  5:08 AM      Result Value Range   Sodium 136  135 - 145 mEq/L   Potassium 3.3 (*) 3.5 - 5.1 mEq/L   Chloride 103  96 - 112 mEq/L   CO2 23  19 - 32 mEq/L   Glucose, Bld 103 (*) 70 - 99 mg/dL   BUN 6  6 - 23 mg/dL   Creatinine, Ser 0.86  0.50 - 1.10 mg/dL   Calcium 7.8 (*) 8.4 - 10.5 mg/dL   Total Protein 5.4 (*) 6.0 - 8.3 g/dL   Albumin 1.5 (*) 3.5 - 5.2 g/dL   AST 578 (*) 0 - 37 U/L   ALT 58 (*) 0 - 35 U/L   Alkaline Phosphatase 205 (*) 39 - 117 U/L   Total Bilirubin 2.3 (*) 0.3 - 1.2  mg/dL   GFR calc non Af Amer 83 (*) >90 mL/min   GFR calc Af Amer >90  >90 mL/min   Comment:            The eGFR has been calculated     using the CKD EPI equation.     This calculation has not been     validated in all clinical     situations.     eGFR's persistently     <90 mL/min signify     possible Chronic Kidney Disease.  MAGNESIUM     Status: None   Collection Time    08/03/12  5:08 AM      Result Value Range   Magnesium 2.0  1.5 - 2.5 mg/dL  HIV ANTIBODY (ROUTINE TESTING)  Status: None   Collection Time    08/03/12  5:08 AM      Result Value Range   HIV NON REACTIVE  NON REACTIVE  RPR     Status: None   Collection Time    08/03/12  5:08 AM      Result Value Range   RPR NON REACTIVE  NON REACTIVE  CBC     Status: Abnormal   Collection Time    08/04/12  4:45 AM      Result Value Range   WBC 19.5 (*) 4.0 - 10.5 K/uL   RBC 3.19 (*) 3.87 - 5.11 MIL/uL   Hemoglobin 9.1 (*) 12.0 - 15.0 g/dL   HCT 14.7 (*) 82.9 - 56.2 %   MCV 77.7 (*) 78.0 - 100.0 fL   MCH 28.5  26.0 - 34.0 pg   MCHC 36.7 (*) 30.0 - 36.0 g/dL   RDW 13.0  86.5 - 78.4 %   Platelets 345  150 - 400 K/uL   Comment: DELTA CHECK NOTED     REPEATED TO VERIFY  BASIC METABOLIC PANEL     Status: Abnormal   Collection Time    08/04/12  4:45 AM      Result Value Range   Sodium 137  135 - 145 mEq/L   Potassium 3.3 (*) 3.5 - 5.1 mEq/L   Chloride 103  96 - 112 mEq/L   CO2 24  19 - 32 mEq/L   Glucose, Bld 116 (*) 70 - 99 mg/dL   BUN 5 (*) 6 - 23 mg/dL   Creatinine, Ser 6.96  0.50 - 1.10 mg/dL   Calcium 7.9 (*) 8.4 - 10.5 mg/dL   GFR calc non Af Amer 83 (*) >90 mL/min   GFR calc Af Amer >90  >90 mL/min   Comment:            The eGFR has been calculated     using the CKD EPI equation.     This calculation has not been     validated in all clinical     situations.     eGFR's persistently     <90 mL/min signify     possible Chronic Kidney Disease.    MICRO: 7/20 urine cx NGTD 7/20 blood cx  NGTD IMAGING: Dg Chest 2 View  08/04/2012   *RADIOLOGY REPORT*  Clinical Data: Pneumonia  CHEST - 2 VIEW  Comparison: 07/14 and 08/02/2012  Findings: Heart mediastinal contours are unchanged with aortic ectasia and normal heart size. Elevation of the right hemidiaphragm is unchanged.  Linear density at the right lung base and less prominently at the left lung base is most compatible with subsegmental atelectasis. More focal density posteriorly on the right is seen on the lateral view suspicious for an area of focal bronchopneumonia.  These findings are unchanged in comparison with the prior exam.  IMPRESSION: Unchanged cardiopulmonary appearance with bibasilar subsegmental atelectasis and probable posterior right lower lobe pneumonia.   Original Report Authenticated By: Rhodia Albright, M.D.    Assessment/Plan:  78yo F with intermittent fevers and failure to thrive found to have leukocytosis, and RLL infiltrate but no significant symptoms of cough. - recommend to continue with piptazo for now - would discontinue vancomycin since no source of MRSA - follow cultures to see if any recurrent bacteremia - incidental finding of trichomonas treated, asymptomatic  Enedina Finner to provide further recs tomorrow  Aram Beecham B. Drue Second MD MPH [facility] [phone]

## 2012-08-04 NOTE — Progress Notes (Signed)
Physical Therapy Treatment Patient Details Name: Sydney Scott MRN: [id number] DOB: [birth year] Today's Date: 08/04/2012 Time: 7829-5621 PT Time Calculation (min): 11 min  PT Assessment / Plan / Recommendation  History of Present Illness This is an 79 year old female, with known history of OA, anemia, hospitalized for culture-negative UTI 07/27/12-07/28/12 and subsequently discharged on Ciprofloxacin. According patient she has been compliant with her antibiotics, and has only 2 pills left. Apparently, since then, patient has become progressively weaker, with poor appetite. She denies subjective fever or chills, denies SOB or cough.   Clinical Impression    PT Comments   Pt stated she feels better.  Assisted OOB to amb in hallway then positioned in recliner.    Follow Up Recommendations  No PT follow up     Does the patient have the potential to tolerate intense rehabilitation     Barriers to Discharge        Equipment Recommendations  None recommended by PT    Recommendations for Other Services    Frequency Min 3X/week   Progress towards PT Goals Progress towards PT goals: Progressing toward goals  Plan      Precautions / Restrictions Precautions Precautions: None    Pertinent Vitals/Pain No c/o pain    Mobility  Bed Mobility Bed Mobility: Supine to Sit;Sit to Supine Supine to Sit: 6: Modified independent (Device/Increase time) Details for Bed Mobility Assistance: increased time Transfers Transfers: Sit to Stand;Stand to Sit Sit to Stand: 6: Modified independent (Device/Increase time);5: Supervision;From bed Stand to Sit: 6: Modified independent (Device/Increase time);5: Supervision;To chair/3-in-1 Details for Transfer Assistance: one VC on safety with hand placement during stand to sit Ambulation/Gait Ambulation/Gait Assistance: 4: Min guard;5: Supervision Ambulation Distance (Feet): 142 Feet Assistive device: None Ambulation/Gait Assistance Details: No AD and  occassional hold to wall rail.  RA sats avg 95%.  No c/o.  Pt states she is feeling better. Gait Pattern: Step-through pattern;Decreased stride length Gait velocity: WFL     PT Goals (current goals can now be found in the care plan section)    Visit Information  Last PT Received On: 08/04/12 Assistance Needed: +1 History of Present Illness: This is an 79 year old female, with known history of OA, anemia, hospitalized for culture-negative UTI 07/27/12-07/28/12 and subsequently discharged on Ciprofloxacin. According patient she has been compliant with her antibiotics, and has only 2 pills left. Apparently, since then, patient has become progressively weaker, with poor appetite. She denies subjective fever or chills, denies SOB or cough.    Subjective Data      Cognition       Balance     End of Session PT - End of Session Equipment Utilized During Treatment: Gait belt Activity Tolerance: Patient tolerated treatment well Patient left: in chair;with call bell/phone within reach;with family/visitor present   Felecia Shelling  PTA [facility]  Acute  Rehab Pager      [phone]

## 2012-08-05 ENCOUNTER — Encounter (HOSPITAL_COMMUNITY): Payer: Self-pay | Admitting: Radiology

## 2012-08-05 ENCOUNTER — Inpatient Hospital Stay (HOSPITAL_COMMUNITY): Payer: Medicare Other

## 2012-08-05 DIAGNOSIS — R5381 Other malaise: Secondary | ICD-10-CM

## 2012-08-05 LAB — BASIC METABOLIC PANEL
CO2: 24 mEq/L (ref 19–32)
GFR calc non Af Amer: 82 mL/min — ABNORMAL LOW (ref >90)
Glucose, Bld: 107 mg/dL — ABNORMAL HIGH (ref 70–99)
Potassium: 3.1 mEq/L — ABNORMAL LOW (ref 3.5–5.1)
Sodium: 137 mEq/L (ref 135–145)

## 2012-08-05 LAB — CBC
Hemoglobin: 9.3 g/dL — ABNORMAL LOW (ref 12.0–15.0)
MCHC: 35.8 g/dL (ref 30.0–36.0)
RBC: 3.32 MIL/uL — ABNORMAL LOW (ref 3.87–5.11)
WBC: 17.9 10*3/uL — ABNORMAL HIGH (ref 4.0–10.5)

## 2012-08-05 MED ORDER — POTASSIUM CHLORIDE CRYS ER 20 MEQ PO TBCR
40.0000 meq | EXTENDED_RELEASE_TABLET | Freq: Two times a day (BID) | ORAL | Status: DC
  Administered 2012-08-05 – 2012-08-11 (×13): 40 meq via ORAL
  Filled 2012-08-05 (×14): qty 2

## 2012-08-05 MED ORDER — IOHEXOL 300 MG/ML  SOLN
50.0000 mL | Freq: Once | INTRAMUSCULAR | Status: AC | PRN
  Administered 2012-08-05: 50 mL via ORAL

## 2012-08-05 MED ORDER — IOHEXOL 300 MG/ML  SOLN
100.0000 mL | Freq: Once | INTRAMUSCULAR | Status: AC | PRN
  Administered 2012-08-05: 100 mL via INTRAVENOUS

## 2012-08-05 NOTE — Progress Notes (Signed)
Assumed care of patient. Agree with previous RN assessment. Will continue to monitor. Sydney Scott

## 2012-08-05 NOTE — Progress Notes (Signed)
[facility] PROGRESS NOTE  Sydney Scott JYN:[id number] DOB: December 03, 1927 DOA: 08/02/2012 PCP: No PCP Per Patient  Assessment/Plan: Active Problems:   HAP (hospital-acquired pneumonia)   Bacteremia    1. HAP (hospital-acquired pneumonia): Patient presented with progressive weakness, low grade pyrexia and FTT, about 5 days post-hospitalization for UTI, despite compliance with Ciprofloxacin therapy. Wcc was elevated at 19.3, with neutrophilia. CXR confirmed RLL pneumonia. This clinical scenario is consistent with HAP. Managed with iv Vancomycin/Zosyn, as well as supportive treatment. Patient does not look toxic, however, although she did spike a temp of 101.4 overnight on 08/03/12 and a low grade temperature on 08/04/12. Patient feels clinically much better, and is afebrile today. Repeat CXR of 08/04/12, showed stable findings. Now day #4 antibiotics.  2. Bacteremia: Review of EMR revealed that blood cultures of 07/27/12, grew microarophilic streptococcus in 1:3 bottles. It is not clear what role this may have in current presentation. Above antibiotic choice should adequately cover this pathogen. Repeat blood cultures are negative so far. In view of recrudescence of fever, 2D Echocardiogram was done on 08/03/12, and this showed normal LV cavity size, mild concentric hypertrophy, EF of 55% to 60% and no regional wall motion abnormalities. The atrium was mildly dilated and valve were sclerotic. Dr Judyann Munson provided ID consultation. Managing as recommended. She discontinued Vancomycin on 08/04/12.  3. Hypokalemia: Potassium was 2.7 on admission. Patient is on no culprit medication, so etiology is unclear. No doubt contributory to generalized weakness. Magnesium level is normal. TSH is 1.214. Repleting as indicated.  4. Anemia: This is mild, microcytic. Anemia panel revealed iron deficiency. FOBT is pending. Patient will benefit from outpatient colonoscopy.  5. Recent UTI: This was diagnosed on  07/27/12, and was culture-negative . Patient has completed a total of 7 days antibiotics, for this. Repeat U/A has revealed persistent pyuria and bacteriuria. Adequately addressed with current antibiotic choice. Urine culture is negative so far.  6. Trichomoniasis: This was an incidental finding on U/A. Addressed with 2g Flagyl PO on 08/03/12. HIV test and RPR are negative.    Code Status: Full Code.  Family Communication:  Disposition Plan: To be determined.    Brief narrative: This is an 79 year old female, with known history of OA, anemia, hospitalized for culture-negative UTI 07/27/12-07/28/12 and subsequently discharged on Ciprofloxacin. According patient she has been compliant with her antibiotics, and has only 2 pills left. Apparently, since then, patient has become progressively weaker, with poor appetite. She denies subjective fever or chills, denies SOB or cough. Review of EMR reveals that blood cultures of 07/27/12. Grew microaerophilic streptococci in 1:3 bottles. CXR in the ED revealed a right basilar opacity. Wcc was 19.3, temp 99.2.    Consultants:  N/A.   Procedures:  CXR.   2D Echocardiogram.   Antibiotics:  Vancomycin 08/02/12>>>  Zosyn 08/02/12>>>  HPI/Subjective: Feels Stronger.   Objective: Vital signs in last 24 hours: Temp:  [97.8 F (36.6 C)-100.9 F (38.3 C)] 97.8 F (36.6 C) (07/23 0630) Pulse Rate:  [78-93] 78 (07/23 0630) Resp:  [16-20] 20 (07/23 0630) BP: (129-138)/(68-78) 135/75 mmHg (07/23 0630) SpO2:  [94 %-98 %] 96 % (07/23 0630) Weight change:  Last BM Date: 08/05/12  Intake/Output from previous day: 07/22 0701 - 07/23 0700 In: 1190 [P.O.:240; I.V.:900; IV Piggyback:50] Out: -      Physical Exam: General: Comfortable. Alert, communicative, fully oriented, talking in complete sentences, not short of breath at rest.  HEENT: Mild clinical pallor, no jaundice, no conjunctival injection or  discharge. Hydration is satisfactory.  NECK:  Supple, JVP not seen, no carotid bruits, no palpable lymphadenopathy, no palpable goiter.  CHEST: Clinically clear to auscultation, no wheezes, no crackles.  HEART: Sounds 1 and 2 heard, normal, regular, no murmurs.  ABDOMEN: Full, soft, non-tender, no palpable organomegaly, no palpable masses, normal bowel sounds.  GENITALIA: Not examined.  LOWER EXTREMITIES: No pitting edema, palpable peripheral pulses.  MUSCULOSKELETAL SYSTEM: Generalized osteoarthritic changes, otherwise, normal.  CENTRAL NERVOUS SYSTEM: No focal neurologic deficit on gross examination.  Lab Results:  Recent Labs  08/04/12 0445 08/05/12 0515  WBC 19.5* 17.9*  HGB 9.1* 9.3*  HCT 24.8* 26.0*  PLT 345 344    Recent Labs  08/04/12 0445 08/05/12 0515  NA 137 137  K 3.3* 3.1*  CL 103 105  CO2 24 24  GLUCOSE 116* 107*  BUN 5* 4*  CREATININE 0.57 0.59  CALCIUM 7.9* 8.0*   Recent Results (from the past 240 hour(s))  CULTURE, BLOOD (ROUTINE X 2)     Status: None   Collection Time    07/27/12  9:50 AM      Result Value Range Status   Specimen Description BLOOD RT HAND   Final   Special Requests Normal BOTTLES DRAWN AEROBIC AND ANAEROBIC  4CC   Final   Culture  Setup Time 07/27/2012 15:07   Final   Culture     Final   Value: MICROAEROPHILIC STREPTOCOCCI     Note: Standardized susceptibility testing for this organism is not available.     Note: Gram Stain Report Called to,Read Back By and Verified With: COURTNEY K @1430  07/28/12 BY KRAWS   Report Status 07/30/2012 FINAL   Final  CULTURE, BLOOD (ROUTINE X 2)     Status: None   Collection Time    07/27/12 10:10 AM      Result Value Range Status   Specimen Description BLOOD LEFT HAND   Final   Special Requests Normal BOTTLES DRAWN AEROBIC ONLY  3CC   Final   Culture  Setup Time 07/27/2012 15:07   Final   Culture NO GROWTH 5 DAYS   Final   Report Status 08/02/2012 FINAL   Final  URINE CULTURE     Status: None   Collection Time    07/27/12 11:25 AM       Result Value Range Status   Specimen Description URINE, CATHETERIZED   Final   Special Requests Normal   Final   Culture  Setup Time 07/27/2012 15:30   Final   Colony Count NO GROWTH   Final   Culture NO GROWTH   Final   Report Status 07/28/2012 FINAL   Final  CULTURE, BLOOD (SINGLE)     Status: None   Collection Time    07/27/12  1:00 PM      Result Value Range Status   Specimen Description BLOOD R HAND   Final   Special Requests BOTTLES DRAWN AEROBIC AND ANAEROBIC 4.5 CC   Final   Culture  Setup Time 07/27/2012 15:07   Final   Culture NO GROWTH 5 DAYS   Final   Report Status 08/02/2012 FINAL   Final  URINE CULTURE     Status: None   Collection Time    07/27/12  7:37 PM      Result Value Range Status   Specimen Description URINE, RANDOM   Final   Special Requests NONE   Final   Culture  Setup Time 07/28/2012 00:42   Final  Colony Count NO GROWTH   Final   Culture NO GROWTH   Final   Report Status 07/28/2012 FINAL   Final  CULTURE, BLOOD (ROUTINE X 2)     Status: None   Collection Time    08/02/12  4:40 PM      Result Value Range Status   Specimen Description BLOOD RIGHT HAND   Final   Special Requests NONE   Final   Culture  Setup Time 08/02/2012 20:54   Final   Culture     Final   Value:        BLOOD CULTURE RECEIVED NO GROWTH TO DATE CULTURE WILL BE HELD FOR 5 DAYS BEFORE ISSUING A FINAL NEGATIVE REPORT   Report Status PENDING   Incomplete  CULTURE, BLOOD (ROUTINE X 2)     Status: None   Collection Time    08/02/12  5:34 PM      Result Value Range Status   Specimen Description BLOOD LEFT ANTECUBITAL   Final   Special Requests BOTTLES DRAWN AEROBIC AND ANAEROBIC 4CC   Final   Culture  Setup Time 08/02/2012 20:47   Final   Culture     Final   Value:        BLOOD CULTURE RECEIVED NO GROWTH TO DATE CULTURE WILL BE HELD FOR 5 DAYS BEFORE ISSUING A FINAL NEGATIVE REPORT   Report Status PENDING   Incomplete  URINE CULTURE     Status: None   Collection Time    08/02/12   6:41 PM      Result Value Range Status   Specimen Description URINE, CLEAN CATCH   Final   Special Requests NONE   Final   Culture  Setup Time 08/03/2012 03:38   Final   Colony Count NO GROWTH   Final   Culture NO GROWTH   Final   Report Status 08/04/2012 FINAL   Final     Studies/Results: Dg Chest 2 View  08/04/2012   *RADIOLOGY REPORT*  Clinical Data: Pneumonia  CHEST - 2 VIEW  Comparison: 07/14 and 08/02/2012  Findings: Heart mediastinal contours are unchanged with aortic ectasia and normal heart size. Elevation of the right hemidiaphragm is unchanged.  Linear density at the right lung base and less prominently at the left lung base is most compatible with subsegmental atelectasis. More focal density posteriorly on the right is seen on the lateral view suspicious for an area of focal bronchopneumonia.  These findings are unchanged in comparison with the prior exam.  IMPRESSION: Unchanged cardiopulmonary appearance with bibasilar subsegmental atelectasis and probable posterior right lower lobe pneumonia.   Original Report Authenticated By: Rhodia Albright, M.D.    Medications: Scheduled Meds: . heparin  5,000 Units Subcutaneous Q8H  . piperacillin-tazobactam (ZOSYN)  IV  3.375 g Intravenous Q8H  . sodium chloride  3 mL Intravenous Q12H   Continuous Infusions: . sodium chloride 50 mL/hr at 08/02/12 1822   PRN Meds:.sodium chloride, acetaminophen, acetaminophen, albuterol, morphine injection, ondansetron (ZOFRAN) IV, ondansetron, oxyCODONE, sodium chloride    LOS: 3 days   Kenyatte Chatmon,CHRISTOPHER  [facility] Pager [phone]. If 8PM-8AM, please contact night-coverage at [email or web address], password [facility] 08/05/2012, 10:58 AM  LOS: 3 days

## 2012-08-05 NOTE — Progress Notes (Signed)
INFECTIOUS DISEASE PROGRESS NOTE  ID: Sydney Scott is a 79 y.o. female with  Active Problems:   HAP (hospital-acquired pneumonia)   Bacteremia  Subjective: Without complaints.  No dysuria, no loose BM, no cough/SOB.   Abtx:  Anti-infectives   Start     Dose/Rate Route Frequency Ordered Stop   08/04/12 0000  vancomycin (VANCOCIN) IVPB 1000 mg/200 mL premix  Status:  Discontinued     1,000 mg 200 mL/hr over 60 Minutes Intravenous Every 12 hours 08/04/12 1920 08/04/12 2037   08/03/12 2200  metroNIDAZOLE (FLAGYL) tablet 1,000 mg     1,000 mg Oral  Once 08/03/12 1159 08/03/12 2150   08/03/12 1000  metroNIDAZOLE (FLAGYL) tablet 1,000 mg     1,000 mg Oral  Once 08/03/12 0901 08/03/12 0942   08/03/12 0200  piperacillin-tazobactam (ZOSYN) IVPB 3.375 g     3.375 g 12.5 mL/hr over 240 Minutes Intravenous Every 8 hours 08/02/12 1710     08/02/12 1800  vancomycin (VANCOCIN) 500 mg in sodium chloride 0.9 % 100 mL IVPB     500 mg 100 mL/hr over 60 Minutes Intravenous Every 12 hours 08/02/12 1710 08/04/12 1931   08/02/12 1700  piperacillin-tazobactam (ZOSYN) IVPB 3.375 g     3.375 g 100 mL/hr over 30 Minutes Intravenous  Once 08/02/12 1656 08/02/12 1859      Medications:  Scheduled: . heparin  5,000 Units Subcutaneous Q8H  . piperacillin-tazobactam (ZOSYN)  IV  3.375 g Intravenous Q8H  . potassium chloride  40 mEq Oral BID  . sodium chloride  3 mL Intravenous Q12H    Objective: Vital signs in last 24 hours: Temp:  [97.8 F (36.6 C)-100.9 F (38.3 C)] 100.7 F (38.2 C) (07/23 1444) Pulse Rate:  [78-94] 94 (07/23 1444) Resp:  [18-20] 18 (07/23 1444) BP: (135-149)/(75-80) 149/80 mmHg (07/23 1444) SpO2:  [94 %-98 %] 94 % (07/23 1444)   General appearance: alert, cooperative and no distress Throat: normal findings: oropharynx pink & moist without lesions or evidence of thrush Neck: no adenopathy and supple, symmetrical, trachea midline Resp: clear to auscultation  bilaterally Cardio: regular rate and rhythm GI: normal findings: bowel sounds normal and soft, non-tender Extremities: edema none  Lab Results  Recent Labs  08/04/12 0445 08/05/12 0515  WBC 19.5* 17.9*  HGB 9.1* 9.3*  HCT 24.8* 26.0*  NA 137 137  K 3.3* 3.1*  CL 103 105  CO2 24 24  BUN 5* 4*  CREATININE 0.57 0.59   Liver Panel  Recent Labs  08/03/12 0508  PROT 5.4*  ALBUMIN 1.5*  AST 117*  ALT 58*  ALKPHOS 205*  BILITOT 2.3*   Sedimentation Rate No results found for this basename: ESRSEDRATE,  in the last 72 hours C-Reactive Protein No results found for this basename: CRP,  in the last 72 hours  Microbiology: Recent Results (from the past 240 hour(s))  CULTURE, BLOOD (ROUTINE X 2)     Status: None   Collection Time    07/27/12  9:50 AM      Result Value Range Status   Specimen Description BLOOD RT HAND   Final   Special Requests Normal BOTTLES DRAWN AEROBIC AND ANAEROBIC  4CC   Final   Culture  Setup Time 07/27/2012 15:07   Final   Culture     Final   Value: MICROAEROPHILIC STREPTOCOCCI     Note: Standardized susceptibility testing for this organism is not available.     Note: Gram Stain Report Called to,Read  Back By and Verified With: COURTNEY K @1430  07/28/12 BY KRAWS   Report Status 07/30/2012 FINAL   Final  CULTURE, BLOOD (ROUTINE X 2)     Status: None   Collection Time    07/27/12 10:10 AM      Result Value Range Status   Specimen Description BLOOD LEFT HAND   Final   Special Requests Normal BOTTLES DRAWN AEROBIC ONLY  3CC   Final   Culture  Setup Time 07/27/2012 15:07   Final   Culture NO GROWTH 5 DAYS   Final   Report Status 08/02/2012 FINAL   Final  URINE CULTURE     Status: None   Collection Time    07/27/12 11:25 AM      Result Value Range Status   Specimen Description URINE, CATHETERIZED   Final   Special Requests Normal   Final   Culture  Setup Time 07/27/2012 15:30   Final   Colony Count NO GROWTH   Final   Culture NO GROWTH   Final    Report Status 07/28/2012 FINAL   Final  CULTURE, BLOOD (SINGLE)     Status: None   Collection Time    07/27/12  1:00 PM      Result Value Range Status   Specimen Description BLOOD R HAND   Final   Special Requests BOTTLES DRAWN AEROBIC AND ANAEROBIC 4.5 CC   Final   Culture  Setup Time 07/27/2012 15:07   Final   Culture NO GROWTH 5 DAYS   Final   Report Status 08/02/2012 FINAL   Final  URINE CULTURE     Status: None   Collection Time    07/27/12  7:37 PM      Result Value Range Status   Specimen Description URINE, RANDOM   Final   Special Requests NONE   Final   Culture  Setup Time 07/28/2012 00:42   Final   Colony Count NO GROWTH   Final   Culture NO GROWTH   Final   Report Status 07/28/2012 FINAL   Final  CULTURE, BLOOD (ROUTINE X 2)     Status: None   Collection Time    08/02/12  4:40 PM      Result Value Range Status   Specimen Description BLOOD RIGHT HAND   Final   Special Requests NONE   Final   Culture  Setup Time 08/02/2012 20:54   Final   Culture     Final   Value:        BLOOD CULTURE RECEIVED NO GROWTH TO DATE CULTURE WILL BE HELD FOR 5 DAYS BEFORE ISSUING A FINAL NEGATIVE REPORT   Report Status PENDING   Incomplete  CULTURE, BLOOD (ROUTINE X 2)     Status: None   Collection Time    08/02/12  5:34 PM      Result Value Range Status   Specimen Description BLOOD LEFT ANTECUBITAL   Final   Special Requests BOTTLES DRAWN AEROBIC AND ANAEROBIC 4CC   Final   Culture  Setup Time 08/02/2012 20:47   Final   Culture     Final   Value:        BLOOD CULTURE RECEIVED NO GROWTH TO DATE CULTURE WILL BE HELD FOR 5 DAYS BEFORE ISSUING A FINAL NEGATIVE REPORT   Report Status PENDING   Incomplete  URINE CULTURE     Status: None   Collection Time    08/02/12  6:41 PM      Result  Value Range Status   Specimen Description URINE, CLEAN CATCH   Final   Special Requests NONE   Final   Culture  Setup Time 08/03/2012 03:38   Final   Colony Count NO GROWTH   Final   Culture NO GROWTH    Final   Report Status 08/04/2012 FINAL   Final    Studies/Results: Dg Chest 2 View  08/04/2012   *RADIOLOGY REPORT*  Clinical Data: Pneumonia  CHEST - 2 VIEW  Comparison: 07/14 and 08/02/2012  Findings: Heart mediastinal contours are unchanged with aortic ectasia and normal heart size. Elevation of the right hemidiaphragm is unchanged.  Linear density at the right lung base and less prominently at the left lung base is most compatible with subsegmental atelectasis. More focal density posteriorly on the right is seen on the lateral view suspicious for an area of focal bronchopneumonia.  These findings are unchanged in comparison with the prior exam.  IMPRESSION: Unchanged cardiopulmonary appearance with bibasilar subsegmental atelectasis and probable posterior right lower lobe pneumonia.   Original Report Authenticated By: Rhodia Albright, M.D.     Assessment/Plan: Fever, Leukocytosis RLL infiltrate Recent Cx (-) UTI  Total days of antibiotics:  Zosyn 7-/20 -->  Vanco 7/20 to 7/22  Recurrence of fever WBC remains elevated.  Could consider CT chest and abd/pelvis if her fever continues No change in zosyn for now Await BCx         [name] Infectious Diseases (pager) [phone] [email or web address] 08/05/2012, 4:06 PM  LOS: 3 days

## 2012-08-06 ENCOUNTER — Inpatient Hospital Stay (HOSPITAL_COMMUNITY): Payer: Medicare Other

## 2012-08-06 ENCOUNTER — Encounter (HOSPITAL_COMMUNITY): Payer: Self-pay | Admitting: Internal Medicine

## 2012-08-06 DIAGNOSIS — D72829 Elevated white blood cell count, unspecified: Secondary | ICD-10-CM

## 2012-08-06 DIAGNOSIS — K7689 Other specified diseases of liver: Secondary | ICD-10-CM

## 2012-08-06 DIAGNOSIS — R509 Fever, unspecified: Secondary | ICD-10-CM

## 2012-08-06 DIAGNOSIS — K750 Abscess of liver: Secondary | ICD-10-CM

## 2012-08-06 LAB — CBC
Hemoglobin: 8.8 g/dL — ABNORMAL LOW (ref 12.0–15.0)
MCH: 28.4 pg (ref 26.0–34.0)
RBC: 3.1 MIL/uL — ABNORMAL LOW (ref 3.87–5.11)

## 2012-08-06 LAB — BASIC METABOLIC PANEL
CO2: 24 mEq/L (ref 19–32)
Calcium: 8.2 mg/dL — ABNORMAL LOW (ref 8.4–10.5)
Glucose, Bld: 106 mg/dL — ABNORMAL HIGH (ref 70–99)
Sodium: 136 mEq/L (ref 135–145)

## 2012-08-06 MED ORDER — MIDAZOLAM HCL 2 MG/2ML IJ SOLN
INTRAMUSCULAR | Status: AC | PRN
  Administered 2012-08-06 (×3): 0.5 mg via INTRAVENOUS

## 2012-08-06 MED ORDER — FENTANYL CITRATE 0.05 MG/ML IJ SOLN
INTRAMUSCULAR | Status: AC | PRN
  Administered 2012-08-06: 50 ug via INTRAVENOUS
  Administered 2012-08-06 (×2): 25 ug via INTRAVENOUS

## 2012-08-06 NOTE — Progress Notes (Signed)
Agree 

## 2012-08-06 NOTE — Progress Notes (Signed)
INFECTIOUS DISEASE PROGRESS NOTE  ID: Sydney Scott is a 79 y.o. female with  Active Problems:   HAP (hospital-acquired pneumonia)   Bacteremia  Subjective: Without complaints  Abtx:  Anti-infectives   Start     Dose/Rate Route Frequency Ordered Stop   08/04/12 0000  vancomycin (VANCOCIN) IVPB 1000 mg/200 mL premix  Status:  Discontinued     1,000 mg 200 mL/hr over 60 Minutes Intravenous Every 12 hours 08/04/12 1920 08/04/12 2037   08/03/12 2200  metroNIDAZOLE (FLAGYL) tablet 1,000 mg     1,000 mg Oral  Once 08/03/12 1159 08/03/12 2150   08/03/12 1000  metroNIDAZOLE (FLAGYL) tablet 1,000 mg     1,000 mg Oral  Once 08/03/12 0901 08/03/12 0942   08/03/12 0200  piperacillin-tazobactam (ZOSYN) IVPB 3.375 g     3.375 g 12.5 mL/hr over 240 Minutes Intravenous Every 8 hours 08/02/12 1710     08/02/12 1800  vancomycin (VANCOCIN) 500 mg in sodium chloride 0.9 % 100 mL IVPB     500 mg 100 mL/hr over 60 Minutes Intravenous Every 12 hours 08/02/12 1710 08/04/12 1931   08/02/12 1700  piperacillin-tazobactam (ZOSYN) IVPB 3.375 g     3.375 g 100 mL/hr over 30 Minutes Intravenous  Once 08/02/12 1656 08/02/12 1859      Medications:  Scheduled: . heparin  5,000 Units Subcutaneous Q8H  . piperacillin-tazobactam (ZOSYN)  IV  3.375 g Intravenous Q8H  . potassium chloride  40 mEq Oral BID  . sodium chloride  3 mL Intravenous Q12H    Objective: Vital signs in last 24 hours: Temp:  [98.8 F (37.1 C)-100.7 F (38.2 C)] 98.8 F (37.1 C) (07/24 0457) Pulse Rate:  [91-96] 91 (07/24 0457) Resp:  [18-20] 20 (07/24 0457) BP: (143-151)/(56-80) 143/79 mmHg (07/24 0457) SpO2:  [94 %-98 %] 97 % (07/24 0457)   General appearance: alert, cooperative and no distress Resp: clear to auscultation bilaterally Cardio: regular rate and rhythm GI: normal findings: bowel sounds normal and soft, non-tender  Lab Results  Recent Labs  08/05/12 0515 08/06/12 0455  WBC 17.9* 17.9*  HGB 9.3* 8.8*    HCT 26.0* 24.3*  NA 137 136  K 3.1* 3.9  CL 105 103  CO2 24 24  BUN 4* 4*  CREATININE 0.59 0.60   Liver Panel No results found for this basename: PROT, ALBUMIN, AST, ALT, ALKPHOS, BILITOT, BILIDIR, IBILI,  in the last 72 hours Sedimentation Rate No results found for this basename: ESRSEDRATE,  in the last 72 hours C-Reactive Protein No results found for this basename: CRP,  in the last 72 hours  Microbiology: Recent Results (from the past 240 hour(s))  URINE CULTURE     Status: None   Collection Time    07/27/12 11:25 AM      Result Value Range Status   Specimen Description URINE, CATHETERIZED   Final   Special Requests Normal   Final   Culture  Setup Time 07/27/2012 15:30   Final   Colony Count NO GROWTH   Final   Culture NO GROWTH   Final   Report Status 07/28/2012 FINAL   Final  CULTURE, BLOOD (SINGLE)     Status: None   Collection Time    07/27/12  1:00 PM      Result Value Range Status   Specimen Description BLOOD R HAND   Final   Special Requests BOTTLES DRAWN AEROBIC AND ANAEROBIC 4.5 CC   Final   Culture  Setup Time 07/27/2012  15:07   Final   Culture NO GROWTH 5 DAYS   Final   Report Status 08/02/2012 FINAL   Final  URINE CULTURE     Status: None   Collection Time    07/27/12  7:37 PM      Result Value Range Status   Specimen Description URINE, RANDOM   Final   Special Requests NONE   Final   Culture  Setup Time 07/28/2012 00:42   Final   Colony Count NO GROWTH   Final   Culture NO GROWTH   Final   Report Status 07/28/2012 FINAL   Final  CULTURE, BLOOD (ROUTINE X 2)     Status: None   Collection Time    08/02/12  4:40 PM      Result Value Range Status   Specimen Description BLOOD RIGHT HAND   Final   Special Requests NONE   Final   Culture  Setup Time 08/02/2012 20:54   Final   Culture     Final   Value:        BLOOD CULTURE RECEIVED NO GROWTH TO DATE CULTURE WILL BE HELD FOR 5 DAYS BEFORE ISSUING A FINAL NEGATIVE REPORT   Report Status PENDING    Incomplete  CULTURE, BLOOD (ROUTINE X 2)     Status: None   Collection Time    08/02/12  5:34 PM      Result Value Range Status   Specimen Description BLOOD LEFT ANTECUBITAL   Final   Special Requests BOTTLES DRAWN AEROBIC AND ANAEROBIC 4CC   Final   Culture  Setup Time 08/02/2012 20:47   Final   Culture     Final   Value:        BLOOD CULTURE RECEIVED NO GROWTH TO DATE CULTURE WILL BE HELD FOR 5 DAYS BEFORE ISSUING A FINAL NEGATIVE REPORT   Report Status PENDING   Incomplete  URINE CULTURE     Status: None   Collection Time    08/02/12  6:41 PM      Result Value Range Status   Specimen Description URINE, CLEAN CATCH   Final   Special Requests NONE   Final   Culture  Setup Time 08/03/2012 03:38   Final   Colony Count NO GROWTH   Final   Culture NO GROWTH   Final   Report Status 08/04/2012 FINAL   Final    Studies/Results: Dg Chest 2 View  08/04/2012   *RADIOLOGY REPORT*  Clinical Data: Pneumonia  CHEST - 2 VIEW  Comparison: 07/14 and 08/02/2012  Findings: Heart mediastinal contours are unchanged with aortic ectasia and normal heart size. Elevation of the right hemidiaphragm is unchanged.  Linear density at the right lung base and less prominently at the left lung base is most compatible with subsegmental atelectasis. More focal density posteriorly on the right is seen on the lateral view suspicious for an area of focal bronchopneumonia.  These findings are unchanged in comparison with the prior exam.  IMPRESSION: Unchanged cardiopulmonary appearance with bibasilar subsegmental atelectasis and probable posterior right lower lobe pneumonia.   Original Report Authenticated By: Rhodia Albright, M.D.   Ct Chest W Contrast  08/05/2012   *RADIOLOGY REPORT*  Clinical Data:  Fever of unknown origin.  Elevated white cell count.  Pneumonia.  Right lower lobe infiltrates.  CT CHEST, ABDOMEN AND PELVIS WITH CONTRAST  Technique:  Multidetector CT imaging of the chest, abdomen and pelvis was performed  following the standard protocol during bolus administration of  intravenous contrast.  Contrast: OMNIPAQUE IOHEXOL 300 MG/ML  SOLN, 50mL OMNIPAQUE IOHEXOL 300 MG/ML  SOLN  Comparison:   None.  CT CHEST  Findings:  Normal heart size.  Coronary artery calcifications. Normal caliber thoracic aorta with calcification.  Great vessels are unremarkable.  No evidence of aortic dissection.  No significant lymphadenopathy in the chest.  The esophagus is decompressed.  Small bilateral pleural effusions with atelectasis or consolidation in the lung bases.  Visualization of lung fields is limited due to respiratory motion artifact but no apparent pneumothorax or interstitial changes are identified.  Airways appear patent.  Emphysematous changes and scarring in the apices. Degenerative changes in the thoracic spine.  No destructive bone lesions appreciated.  IMPRESSION: Small bilateral pleural effusions with atelectasis or consolidation in the lung bases.  CT ABDOMEN AND PELVIS  Findings:  There is a large circumscribed loculated hypodense structure involving most of the right lobe of the liver and measuring about 9.9 x 5.9 by 8.7 cm.  There is mild peripheral enhancement.  Given the history of fevers, this may represent hepatic abscess.  Necrotic mass or metastasis is not excluded.  No bile duct dilatation.  The gallbladder is contracted with mildly thickened wall.  Gallstones are present.  No pericholecystic infiltration.  The pancreas, spleen, adrenal glands, inferior vena cava, and retroperitoneal lymph nodes are unremarkable.  Cysts in the lower pole of the right kidney, largest measuring 3.2 cm diameter.  No evidence of solid mass or hydronephrosis in either kidney.  There is an infrarenal abdominal aortic aneurysm extending to the bifurcation with involvement of the right iliac artery.  The aneurysm measures up to about 4 cm maximal AP diameter by 4.1 cm transverse diameter.  Right iliac artery measures 1.9 cm  diameter. There is extensive aortoiliac calcification with mild mural thrombus. No abnormal retroperitoneal fluid collection to suggest rupture.  The stomach is decompressed.  Small bowel are decompressed.  Stool filled colon without distension.  No free air or free fluid in the abdomen.  Abdominal wall musculature appears intact although atrophic.  Pelvis:  Small amount of free fluid in the pelvis.  No loculated fluid collections are identified.  Calcification in the uterus suggesting fibroids.  No abnormal adnexal masses.  The bladder demonstrates diffusely thick walled with diverticula arising from the right lateral wall and the anterior wall of the bladder. Changes suggest cystitis versus hypertrophy due to outlet obstruction.  The appendix is normal.  There is diverticulosis of the sigmoid colon.  No inflammatory changes to suggest diverticulitis.  There are giant appearing stool filled diverticula present.  These measure up to about 3.2 cm diameter.  No significant pelvic lymphadenopathy.  Focal subcutaneous gas collection in the anterior left abdominal wall likely representing injection site.  Degenerative changes in the lumbar spine with normal alignment.  No destructive bone lesions are appreciated.  IMPRESSION: Lobulated hypodense lesion in the right lobe of the liver likely represents a hepatic abscess versus necrotic tumor.  No evidence of appendicitis or diverticulitis.  The gallbladder is contracted with stones.  Cholecystitis is not excluded.  Extensive diverticulosis of the sigmoid colon.  Abdominal aortic aneurysm measuring up to 4.1 cm maximal AP diameter. Bladder wall thickening with bladder diverticula.   Original Report Authenticated By: Burman Nieves, M.D.   Ct Abdomen Pelvis W Contrast  08/05/2012   *RADIOLOGY REPORT*  Clinical Data:  Fever of unknown origin.  Elevated white cell count.  Pneumonia.  Right lower lobe infiltrates.  CT CHEST,  ABDOMEN AND PELVIS WITH CONTRAST  Technique:   Multidetector CT imaging of the chest, abdomen and pelvis was performed following the standard protocol during bolus administration of intravenous contrast.  Contrast: OMNIPAQUE IOHEXOL 300 MG/ML  SOLN, 50mL OMNIPAQUE IOHEXOL 300 MG/ML  SOLN  Comparison:   None.  CT CHEST  Findings:  Normal heart size.  Coronary artery calcifications. Normal caliber thoracic aorta with calcification.  Great vessels are unremarkable.  No evidence of aortic dissection.  No significant lymphadenopathy in the chest.  The esophagus is decompressed.  Small bilateral pleural effusions with atelectasis or consolidation in the lung bases.  Visualization of lung fields is limited due to respiratory motion artifact but no apparent pneumothorax or interstitial changes are identified.  Airways appear patent.  Emphysematous changes and scarring in the apices. Degenerative changes in the thoracic spine.  No destructive bone lesions appreciated.  IMPRESSION: Small bilateral pleural effusions with atelectasis or consolidation in the lung bases.  CT ABDOMEN AND PELVIS  Findings:  There is a large circumscribed loculated hypodense structure involving most of the right lobe of the liver and measuring about 9.9 x 5.9 by 8.7 cm.  There is mild peripheral enhancement.  Given the history of fevers, this may represent hepatic abscess.  Necrotic mass or metastasis is not excluded.  No bile duct dilatation.  The gallbladder is contracted with mildly thickened wall.  Gallstones are present.  No pericholecystic infiltration.  The pancreas, spleen, adrenal glands, inferior vena cava, and retroperitoneal lymph nodes are unremarkable.  Cysts in the lower pole of the right kidney, largest measuring 3.2 cm diameter.  No evidence of solid mass or hydronephrosis in either kidney.  There is an infrarenal abdominal aortic aneurysm extending to the bifurcation with involvement of the right iliac artery.  The aneurysm measures up to about 4 cm maximal AP diameter by  4.1 cm transverse diameter.  Right iliac artery measures 1.9 cm diameter. There is extensive aortoiliac calcification with mild mural thrombus. No abnormal retroperitoneal fluid collection to suggest rupture.  The stomach is decompressed.  Small bowel are decompressed.  Stool filled colon without distension.  No free air or free fluid in the abdomen.  Abdominal wall musculature appears intact although atrophic.  Pelvis:  Small amount of free fluid in the pelvis.  No loculated fluid collections are identified.  Calcification in the uterus suggesting fibroids.  No abnormal adnexal masses.  The bladder demonstrates diffusely thick walled with diverticula arising from the right lateral wall and the anterior wall of the bladder. Changes suggest cystitis versus hypertrophy due to outlet obstruction.  The appendix is normal.  There is diverticulosis of the sigmoid colon.  No inflammatory changes to suggest diverticulitis.  There are giant appearing stool filled diverticula present.  These measure up to about 3.2 cm diameter.  No significant pelvic lymphadenopathy.  Focal subcutaneous gas collection in the anterior left abdominal wall likely representing injection site.  Degenerative changes in the lumbar spine with normal alignment.  No destructive bone lesions are appreciated.  IMPRESSION: Lobulated hypodense lesion in the right lobe of the liver likely represents a hepatic abscess versus necrotic tumor.  No evidence of appendicitis or diverticulitis.  The gallbladder is contracted with stones.  Cholecystitis is not excluded.  Extensive diverticulosis of the sigmoid colon.  Abdominal aortic aneurysm measuring up to 4.1 cm maximal AP diameter. Bladder wall thickening with bladder diverticula.   Original Report Authenticated By: Burman Nieves, M.D.     Assessment/Plan: Hepatic Abscess vs  Tumor Fever, Leukocytosis  bibasilar consolidation Recent Cx (-) UTI   Appreciate Dr Alison Murray f/u and surgical eval. Await  tissue/fluid No change in anbx for now  Total days of antibiotics:  Zosyn 7-/20 -->  Vanco 7/20 to 7/22          [name] Infectious Diseases (pager) [phone] [email or web address] 08/06/2012, 10:40 AM  LOS: 4 days

## 2012-08-06 NOTE — Progress Notes (Signed)
Physical Therapy Treatment Patient Details Name: Sydney Scott MRN: [id number] DOB: 1927/09/05 Today's Date: 08/06/2012 Time: 4098-1191 PT Time Calculation (min): 13 min  PT Assessment / Plan / Recommendation  History of Present Illness This is an 79 year old female, with known history of OA, anemia, hospitalized for culture-negative UTI 07/27/12-07/28/12 and subsequently discharged on Ciprofloxacin. According patient she has been compliant with her antibiotics, and has only 2 pills left. Apparently, since then, patient has become progressively weaker, with poor appetite. She denies subjective fever or chills, denies SOB or cough.   Clinical Impression    PT Comments   Pt feeling better each day. Assisted OOB to amb in hallway then back to bed per pt request.  Follow Up Recommendations  No PT follow up     Does the patient have the potential to tolerate intense rehabilitation     Barriers to Discharge        Equipment Recommendations       Recommendations for Other Services    Frequency Min 3X/week   Progress towards PT Goals    Plan      Precautions / Restrictions Precautions Precautions: None    Pertinent Vitals/Pain No c/o pain    Mobility  Bed Mobility Bed Mobility: Supine to Sit;Sit to Supine Supine to Sit: 6: Modified independent (Device/Increase time) Sit to Supine: 6: Modified independent (Device/Increase time) Details for Bed Mobility Assistance: increased time Transfers Transfers: Sit to Stand;Stand to Sit Sit to Stand: 6: Modified independent (Device/Increase time);5: Supervision;From bed Stand to Sit: 6: Modified independent (Device/Increase time);5: Supervision;To chair/3-in-1 Details for Transfer Assistance: one VC on safety with hand placement during stand to sit Ambulation/Gait Ambulation/Gait Assistance: 4: Min guard;5: Supervision Ambulation Distance (Feet): 450 Feet Assistive device: None Ambulation/Gait Assistance Details: amb w/o AD with avg HR  78 Gait Pattern: Step-through pattern Gait velocity: WFL     PT Goals (current goals can now be found in the care plan section)    Visit Information  Last PT Received On: 08/06/12 Assistance Needed: +1 History of Present Illness: This is an 79 year old female, with known history of OA, anemia, hospitalized for culture-negative UTI 07/27/12-07/28/12 and subsequently discharged on Ciprofloxacin. According patient she has been compliant with her antibiotics, and has only 2 pills left. Apparently, since then, patient has become progressively weaker, with poor appetite. She denies subjective fever or chills, denies SOB or cough.    Subjective Data      Cognition       Balance     End of Session PT - End of Session Equipment Utilized During Treatment: Gait belt Activity Tolerance: Patient tolerated treatment well Patient left: in bed;with call bell/phone within reach;with family/visitor present   Felecia Shelling  PTA [facility]  Acute  Rehab Pager      [phone]

## 2012-08-06 NOTE — Consult Note (Signed)
Reason for Consult: Liver cyst, persistent fevers Referring Physician: Dr. Norm Salt  Sydney Scott is an 79 y.o. female.  HPI: 79 y.o. female recent admission on 7/14 for uti given 7 day course of levofloxacin ( + UA, but urine cx negative, did have 1 of 4 bottles microaerophilic strep thought to be contaminant). Represented to the ED 7/19, with intermittent fevers, failure to thrive. She had fever of 101 in ED. Labs reveal leukocytosis of 19.5 with left shift of 86%N, cxr shows possible RLL infiltrate, and UA shows pyuria but has numerous squamous cells. Blood and urine cultures are showing no growth. She was empirically started on vancomycin and piptazo. Fever curve trending down since admission. She denies any pulmonary symptoms, no dysuria, nausea, vomiting or abdominal pain. Did have constipation but resolved with ingesting goat's milk. She is feeling significantly better since admission. She reports ambulating in the halls, sitting the chair. ID was consulted due to the persistent fevers and they ordered a CT of the chest and abdomen.  This showed a large circumscribed loculated hypodense  structure involving most of the right lobe of the liver and measuring about 9.9 x 5.9 by 8.7 cm.  We are asked to see the patient to give surgical opinion.  The patient has not travelled outside of the country but does eat lots of fresh fruits and vegetables from gardens.  She relates that she washes her food well before consuming.    Past Medical History  Diagnosis Date  . Arthritis     Past Surgical History  Procedure Laterality Date  . No past surgeries      History reviewed. No pertinent family history.  Social History:  reports that she quit smoking about 34 years ago. Her smoking use included Cigarettes. She smoked 0.00 packs per day for 20 years. She has never used smokeless tobacco. She reports that she does not drink alcohol or use illicit drugs.  Allergies:  Allergies  Allergen Reactions  .  Aspirin Nausea And Vomiting    Medications: I have reviewed the patient's current medications.  Results for orders placed during the hospital encounter of 08/02/12 (from the past 48 hour(s))  VANCOMYCIN, TROUGH     Status: Abnormal   Collection Time    08/04/12  5:10 PM      Result Value Range   Vancomycin Tr <5.0 (*) 10.0 - 20.0 ug/mL  CBC     Status: Abnormal   Collection Time    08/05/12  5:15 AM      Result Value Range   WBC 17.9 (*) 4.0 - 10.5 K/uL   RBC 3.32 (*) 3.87 - 5.11 MIL/uL   Hemoglobin 9.3 (*) 12.0 - 15.0 g/dL   HCT 16.1 (*) 09.6 - 04.5 %   MCV 78.3  78.0 - 100.0 fL   MCH 28.0  26.0 - 34.0 pg   MCHC 35.8  30.0 - 36.0 g/dL   RDW 40.9  81.1 - 91.4 %   Platelets 344  150 - 400 K/uL  BASIC METABOLIC PANEL     Status: Abnormal   Collection Time    08/05/12  5:15 AM      Result Value Range   Sodium 137  135 - 145 mEq/L   Potassium 3.1 (*) 3.5 - 5.1 mEq/L   Chloride 105  96 - 112 mEq/L   CO2 24  19 - 32 mEq/L   Glucose, Bld 107 (*) 70 - 99 mg/dL   BUN 4 (*) 6 -  23 mg/dL   Creatinine, Ser 9.62  0.50 - 1.10 mg/dL   Calcium 8.0 (*) 8.4 - 10.5 mg/dL   GFR calc non Af Amer 82 (*) >90 mL/min   GFR calc Af Amer >90  >90 mL/min   Comment:            The eGFR has been calculated     using the CKD EPI equation.     This calculation has not been     validated in all clinical     situations.     eGFR's persistently     <90 mL/min signify     possible Chronic Kidney Disease.  CBC     Status: Abnormal   Collection Time    08/06/12  4:55 AM      Result Value Range   WBC 17.9 (*) 4.0 - 10.5 K/uL   RBC 3.10 (*) 3.87 - 5.11 MIL/uL   Hemoglobin 8.8 (*) 12.0 - 15.0 g/dL   HCT 95.2 (*) 84.1 - 32.4 %   MCV 78.4  78.0 - 100.0 fL   MCH 28.4  26.0 - 34.0 pg   MCHC 36.2 (*) 30.0 - 36.0 g/dL   RDW 40.1  02.7 - 25.3 %   Platelets 320  150 - 400 K/uL  BASIC METABOLIC PANEL     Status: Abnormal   Collection Time    08/06/12  4:55 AM      Result Value Range   Sodium 136  135 -  145 mEq/L   Potassium 3.9  3.5 - 5.1 mEq/L   Comment: RESULT REPEATED AND VERIFIED     DELTA CHECK NOTED   Chloride 103  96 - 112 mEq/L   CO2 24  19 - 32 mEq/L   Glucose, Bld 106 (*) 70 - 99 mg/dL   BUN 4 (*) 6 - 23 mg/dL   Creatinine, Ser 6.64  0.50 - 1.10 mg/dL   Calcium 8.2 (*) 8.4 - 10.5 mg/dL   GFR calc non Af Amer 82 (*) >90 mL/min   GFR calc Af Amer >90  >90 mL/min   Comment:            The eGFR has been calculated     using the CKD EPI equation.     This calculation has not been     validated in all clinical     situations.     eGFR's persistently     <90 mL/min signify     possible Chronic Kidney Disease.    Dg Chest 2 View  08/04/2012   *RADIOLOGY REPORT*  Clinical Data: Pneumonia  CHEST - 2 VIEW  Comparison: 07/14 and 08/02/2012  Findings: Heart mediastinal contours are unchanged with aortic ectasia and normal heart size. Elevation of the right hemidiaphragm is unchanged.  Linear density at the right lung base and less prominently at the left lung base is most compatible with subsegmental atelectasis. More focal density posteriorly on the right is seen on the lateral view suspicious for an area of focal bronchopneumonia.  These findings are unchanged in comparison with the prior exam.  IMPRESSION: Unchanged cardiopulmonary appearance with bibasilar subsegmental atelectasis and probable posterior right lower lobe pneumonia.   Original Report Authenticated By: Rhodia Albright, M.D.   Ct Chest W Contrast  08/05/2012   *RADIOLOGY REPORT*  Clinical Data:  Fever of unknown origin.  Elevated white cell count.  Pneumonia.  Right lower lobe infiltrates.  CT CHEST, ABDOMEN AND PELVIS WITH CONTRAST  Technique:  Multidetector CT imaging of the chest, abdomen and pelvis was performed following the standard protocol during bolus administration of intravenous contrast.  Contrast: OMNIPAQUE IOHEXOL 300 MG/ML  SOLN, 50mL OMNIPAQUE IOHEXOL 300 MG/ML  SOLN  Comparison:   None.  CT CHEST   Findings:  Normal heart size.  Coronary artery calcifications. Normal caliber thoracic aorta with calcification.  Great vessels are unremarkable.  No evidence of aortic dissection.  No significant lymphadenopathy in the chest.  The esophagus is decompressed.  Small bilateral pleural effusions with atelectasis or consolidation in the lung bases.  Visualization of lung fields is limited due to respiratory motion artifact but no apparent pneumothorax or interstitial changes are identified.  Airways appear patent.  Emphysematous changes and scarring in the apices. Degenerative changes in the thoracic spine.  No destructive bone lesions appreciated.  IMPRESSION: Small bilateral pleural effusions with atelectasis or consolidation in the lung bases.  CT ABDOMEN AND PELVIS  Findings:  There is a large circumscribed loculated hypodense structure involving most of the right lobe of the liver and measuring about 9.9 x 5.9 by 8.7 cm.  There is mild peripheral enhancement.  Given the history of fevers, this may represent hepatic abscess.  Necrotic mass or metastasis is not excluded.  No bile duct dilatation.  The gallbladder is contracted with mildly thickened wall.  Gallstones are present.  No pericholecystic infiltration.  The pancreas, spleen, adrenal glands, inferior vena cava, and retroperitoneal lymph nodes are unremarkable.  Cysts in the lower pole of the right kidney, largest measuring 3.2 cm diameter.  No evidence of solid mass or hydronephrosis in either kidney.  There is an infrarenal abdominal aortic aneurysm extending to the bifurcation with involvement of the right iliac artery.  The aneurysm measures up to about 4 cm maximal AP diameter by 4.1 cm transverse diameter.  Right iliac artery measures 1.9 cm diameter. There is extensive aortoiliac calcification with mild mural thrombus. No abnormal retroperitoneal fluid collection to suggest rupture.  The stomach is decompressed.  Small bowel are decompressed.  Stool  filled colon without distension.  No free air or free fluid in the abdomen.  Abdominal wall musculature appears intact although atrophic.  Pelvis:  Small amount of free fluid in the pelvis.  No loculated fluid collections are identified.  Calcification in the uterus suggesting fibroids.  No abnormal adnexal masses.  The bladder demonstrates diffusely thick walled with diverticula arising from the right lateral wall and the anterior wall of the bladder. Changes suggest cystitis versus hypertrophy due to outlet obstruction.  The appendix is normal.  There is diverticulosis of the sigmoid colon.  No inflammatory changes to suggest diverticulitis.  There are giant appearing stool filled diverticula present.  These measure up to about 3.2 cm diameter.  No significant pelvic lymphadenopathy.  Focal subcutaneous gas collection in the anterior left abdominal wall likely representing injection site.  Degenerative changes in the lumbar spine with normal alignment.  No destructive bone lesions are appreciated.  IMPRESSION: Lobulated hypodense lesion in the right lobe of the liver likely represents a hepatic abscess versus necrotic tumor.  No evidence of appendicitis or diverticulitis.  The gallbladder is contracted with stones.  Cholecystitis is not excluded.  Extensive diverticulosis of the sigmoid colon.  Abdominal aortic aneurysm measuring up to 4.1 cm maximal AP diameter. Bladder wall thickening with bladder diverticula.   Original Report Authenticated By: Burman Nieves, M.D.   Ct Abdomen Pelvis W Contrast  08/05/2012   *RADIOLOGY REPORT*  Clinical Data:  Fever of unknown origin.  Elevated white cell count.  Pneumonia.  Right lower lobe infiltrates.  CT CHEST, ABDOMEN AND PELVIS WITH CONTRAST  Technique:  Multidetector CT imaging of the chest, abdomen and pelvis was performed following the standard protocol during bolus administration of intravenous contrast.  Contrast: OMNIPAQUE IOHEXOL 300 MG/ML  SOLN, 50mL  OMNIPAQUE IOHEXOL 300 MG/ML  SOLN  Comparison:   None.  CT CHEST  Findings:  Normal heart size.  Coronary artery calcifications. Normal caliber thoracic aorta with calcification.  Great vessels are unremarkable.  No evidence of aortic dissection.  No significant lymphadenopathy in the chest.  The esophagus is decompressed.  Small bilateral pleural effusions with atelectasis or consolidation in the lung bases.  Visualization of lung fields is limited due to respiratory motion artifact but no apparent pneumothorax or interstitial changes are identified.  Airways appear patent.  Emphysematous changes and scarring in the apices. Degenerative changes in the thoracic spine.  No destructive bone lesions appreciated.  IMPRESSION: Small bilateral pleural effusions with atelectasis or consolidation in the lung bases.  CT ABDOMEN AND PELVIS  Findings:  There is a large circumscribed loculated hypodense structure involving most of the right lobe of the liver and measuring about 9.9 x 5.9 by 8.7 cm.  There is mild peripheral enhancement.  Given the history of fevers, this may represent hepatic abscess.  Necrotic mass or metastasis is not excluded.  No bile duct dilatation.  The gallbladder is contracted with mildly thickened wall.  Gallstones are present.  No pericholecystic infiltration.  The pancreas, spleen, adrenal glands, inferior vena cava, and retroperitoneal lymph nodes are unremarkable.  Cysts in the lower pole of the right kidney, largest measuring 3.2 cm diameter.  No evidence of solid mass or hydronephrosis in either kidney.  There is an infrarenal abdominal aortic aneurysm extending to the bifurcation with involvement of the right iliac artery.  The aneurysm measures up to about 4 cm maximal AP diameter by 4.1 cm transverse diameter.  Right iliac artery measures 1.9 cm diameter. There is extensive aortoiliac calcification with mild mural thrombus. No abnormal retroperitoneal fluid collection to suggest rupture.  The  stomach is decompressed.  Small bowel are decompressed.  Stool filled colon without distension.  No free air or free fluid in the abdomen.  Abdominal wall musculature appears intact although atrophic.  Pelvis:  Small amount of free fluid in the pelvis.  No loculated fluid collections are identified.  Calcification in the uterus suggesting fibroids.  No abnormal adnexal masses.  The bladder demonstrates diffusely thick walled with diverticula arising from the right lateral wall and the anterior wall of the bladder. Changes suggest cystitis versus hypertrophy due to outlet obstruction.  The appendix is normal.  There is diverticulosis of the sigmoid colon.  No inflammatory changes to suggest diverticulitis.  There are giant appearing stool filled diverticula present.  These measure up to about 3.2 cm diameter.  No significant pelvic lymphadenopathy.  Focal subcutaneous gas collection in the anterior left abdominal wall likely representing injection site.  Degenerative changes in the lumbar spine with normal alignment.  No destructive bone lesions are appreciated.  IMPRESSION: Lobulated hypodense lesion in the right lobe of the liver likely represents a hepatic abscess versus necrotic tumor.  No evidence of appendicitis or diverticulitis.  The gallbladder is contracted with stones.  Cholecystitis is not excluded.  Extensive diverticulosis of the sigmoid colon.  Abdominal aortic aneurysm measuring up to 4.1 cm maximal AP diameter. Bladder wall thickening with  bladder diverticula.   Original Report Authenticated By: Burman Nieves, M.D.    Review of Systems  Constitutional: Positive for fever, chills and malaise/fatigue.  HENT: Negative.   Eyes: Negative.   Respiratory: Negative.   Cardiovascular: Negative.   Gastrointestinal: Negative.   Genitourinary: Negative.   Musculoskeletal: Negative.   Skin: Negative.   Neurological: Negative.   Endo/Heme/Allergies: Negative.   Psychiatric/Behavioral: Negative.     Blood pressure 143/79, pulse 91, temperature 98.8 F (37.1 C), temperature source Oral, resp. rate 20, height 5\' 4"  (1.626 m), weight 153 lb (69.4 kg), SpO2 97.00%. Physical Exam  Constitutional: She is oriented to person, place, and time. She appears well-developed and well-nourished. No distress.  HENT:  Head: Normocephalic and atraumatic.  Eyes: Conjunctivae are normal. Pupils are equal, round, and reactive to light.  Neck: Normal range of motion. Neck supple.  Cardiovascular: Normal rate and regular rhythm.   Respiratory: Effort normal and breath sounds normal.  GI: Soft. Bowel sounds are normal. She exhibits no distension and no mass. There is no tenderness. There is no guarding.  Genitourinary:  deferred  Musculoskeletal: Normal range of motion. She exhibits no edema.  Neurological: She is alert and oriented to person, place, and time.  Skin: Skin is warm and dry.  Psychiatric: She has a normal mood and affect. Her behavior is normal. Thought content normal.    Assessment/Plan: Large Liver cyst with persistent fevers/leukocytosis despite antibiotic therapy: this likely represents an abscess, although can not eliminate tumor, recommend IR consult to aspirated and possible perc drain the cyst and send the fluid for culture and sensitivities.  Continue broad spectrum coverage for now.  Will follow after perc drain.    WHITE, ELIZABETH 08/06/2012, 10:52 AM

## 2012-08-06 NOTE — Progress Notes (Signed)
[facility] PROGRESS NOTE  Sydney Scott WUJ:[id number] DOB: 1927-05-10 DOA: 08/02/2012 PCP: No PCP Per Patient  Assessment/Plan: Active Problems:   HAP (hospital-acquired pneumonia)   Bacteremia    1. HAP (hospital-acquired pneumonia): Patient presented with progressive weakness, low grade pyrexia and FTT, about 5 days post-hospitalization for UTI, despite compliance with Ciprofloxacin therapy. Wcc was elevated at 19.3, with neutrophilia. CXR confirmed RLL pneumonia. This clinical scenario is consistent with HAP. Managed with iv Vancomycin/Zosyn, as well as supportive treatment. Patient does not look toxic, however, although she did spike a temp of 101.4 overnight on 08/03/12 and a low grade temperature on 08/04/12. Patient feels clinically much better. Repeat CXR of 08/04/12, showed stable findings. Chest CT scan of 08/05/12, showed small bilateral pleural effusions with atelectasis or consolidation in the lung bases. Now day #5 antibiotics. Patient currently has no respiratory tract symptoms.  2. Bacteremia/Hepatic lesion: Review of EMR revealed that blood cultures of 07/27/12, grew microarophilic streptococcus in 1:3 bottles. It is not clear what role this may have had in current presentation. Above antibiotic choice should adequately cover this pathogen. Repeat blood cultures are negative so far. In view of recrudescence of fever, 2D Echocardiogram was done on 08/03/12, and this showed normal LV cavity size, mild concentric hypertrophy, EF of 55% to 60% and no regional wall motion abnormalities. The atrium was mildly dilated and valve were sclerotic. Dr Judyann Munson provided ID consultation. Managing as recommended. She discontinued Vancomycin on 08/04/12. Patient was subsequently seen by Dr Tinnie Gens hatcher, and he recommended Chest and Abdominal/Pelvic CT scan. Chest CT scan findings are described in #1 above. Abdominal/Pelvic CT on the other hand, revealed a large circumscribed loculated  hypodense structure involving most of the right lobe of the liver and measuring about 9.9 x 5.9 by 8.7 cm. There is mild peripheral enhancement. Given the history of fevers, this may represent hepatic abscess. Necrotic mass or metastasis is not excluded. Have discussed with Dr Ninetta Lights and surgical consult requested (Dr Erling Conte al). .  3. Hypokalemia: Potassium was 2.7 on admission. Patient is on no culprit medication, so etiology is unclear. No doubt contributory to generalized weakness. Magnesium level is normal. TSH is 1.214. Repleted as indicated. Resolved.  4. Anemia: This is mild, microcytic. Anemia panel revealed iron deficiency. FOBT is pending. Patient will benefit from outpatient colonoscopy.  5. Recent UTI: This was diagnosed on 07/27/12, and was culture-negative. Patient has completed a total of 7 days antibiotics, for this. Repeat U/A has revealed persistent pyuria and bacteriuria. Adequately addressed with current antibiotic choice. Urine culture is negative so far.  6. Trichomoniasis: This was an incidental finding on U/A. Addressed with 2g Flagyl PO on 08/03/12. HIV test and RPR are negative.    Code Status: Full Code.  Family Communication:  Disposition Plan: To be determined.    Brief narrative: This is an 79 year old female, with known history of OA, anemia, hospitalized for culture-negative UTI 07/27/12-07/28/12 and subsequently discharged on Ciprofloxacin. According patient she has been compliant with her antibiotics, and has only 2 pills left. Apparently, since then, patient has become progressively weaker, with poor appetite. She denies subjective fever or chills, denies SOB or cough. Review of EMR reveals that blood cultures of 07/27/12. Grew microaerophilic streptococci in 1:3 bottles. CXR in the ED revealed a right basilar opacity. Wcc was 19.3, temp 99.2.    Consultants:  N/A.   Procedures:  CXR.   2D Echocardiogram.   Antibiotics:  Vancomycin  08/02/12-08/04/12  Zosyn  08/02/12>>>  HPI/Subjective: No new issues.   Objective: Vital signs in last 24 hours: Temp:  [98.8 F (37.1 C)-100.7 F (38.2 C)] 98.8 F (37.1 C) (07/24 0457) Pulse Rate:  [91-96] 91 (07/24 0457) Resp:  [18-20] 20 (07/24 0457) BP: (143-151)/(56-80) 143/79 mmHg (07/24 0457) SpO2:  [94 %-98 %] 97 % (07/24 0457) Weight change:  Last BM Date: 08/05/12  Intake/Output from previous day: 07/23 0701 - 07/24 0700 In: 240 [P.O.:240] Out: -  Total I/O In: 240 [P.O.:240] Out: -    Physical Exam: General: Comfortable. Alert, communicative, fully oriented, talking in complete sentences, not short of breath at rest.  HEENT: Mild clinical pallor, no jaundice, no conjunctival injection or discharge. Hydration is satisfactory.  NECK: Supple, JVP not seen, no carotid bruits, no palpable lymphadenopathy, no palpable goiter.  CHEST: Clinically clear to auscultation, no wheezes, no crackles.  HEART: Sounds 1 and 2 heard, normal, regular, no murmurs.  ABDOMEN: Full, soft, non-tender, no palpable organomegaly, no palpable masses, normal bowel sounds.  GENITALIA: Not examined.  LOWER EXTREMITIES: No pitting edema, palpable peripheral pulses.  MUSCULOSKELETAL SYSTEM: Generalized osteoarthritic changes, otherwise, normal.  CENTRAL NERVOUS SYSTEM: No focal neurologic deficit on gross examination.  Lab Results:  Recent Labs  08/05/12 0515 08/06/12 0455  WBC 17.9* 17.9*  HGB 9.3* 8.8*  HCT 26.0* 24.3*  PLT 344 320    Recent Labs  08/05/12 0515 08/06/12 0455  NA 137 136  K 3.1* 3.9  CL 105 103  CO2 24 24  GLUCOSE 107* 106*  BUN 4* 4*  CREATININE 0.59 0.60  CALCIUM 8.0* 8.2*   Recent Results (from the past 240 hour(s))  URINE CULTURE     Status: None   Collection Time    07/27/12 11:25 AM      Result Value Range Status   Specimen Description URINE, CATHETERIZED   Final   Special Requests Normal   Final   Culture  Setup Time 07/27/2012 15:30   Final    Colony Count NO GROWTH   Final   Culture NO GROWTH   Final   Report Status 07/28/2012 FINAL   Final  CULTURE, BLOOD (SINGLE)     Status: None   Collection Time    07/27/12  1:00 PM      Result Value Range Status   Specimen Description BLOOD R HAND   Final   Special Requests BOTTLES DRAWN AEROBIC AND ANAEROBIC 4.5 CC   Final   Culture  Setup Time 07/27/2012 15:07   Final   Culture NO GROWTH 5 DAYS   Final   Report Status 08/02/2012 FINAL   Final  URINE CULTURE     Status: None   Collection Time    07/27/12  7:37 PM      Result Value Range Status   Specimen Description URINE, RANDOM   Final   Special Requests NONE   Final   Culture  Setup Time 07/28/2012 00:42   Final   Colony Count NO GROWTH   Final   Culture NO GROWTH   Final   Report Status 07/28/2012 FINAL   Final  CULTURE, BLOOD (ROUTINE X 2)     Status: None   Collection Time    08/02/12  4:40 PM      Result Value Range Status   Specimen Description BLOOD RIGHT HAND   Final   Special Requests NONE   Final   Culture  Setup Time 08/02/2012 20:54   Final   Culture  Final   Value:        BLOOD CULTURE RECEIVED NO GROWTH TO DATE CULTURE WILL BE HELD FOR 5 DAYS BEFORE ISSUING A FINAL NEGATIVE REPORT   Report Status PENDING   Incomplete  CULTURE, BLOOD (ROUTINE X 2)     Status: None   Collection Time    08/02/12  5:34 PM      Result Value Range Status   Specimen Description BLOOD LEFT ANTECUBITAL   Final   Special Requests BOTTLES DRAWN AEROBIC AND ANAEROBIC 4CC   Final   Culture  Setup Time 08/02/2012 20:47   Final   Culture     Final   Value:        BLOOD CULTURE RECEIVED NO GROWTH TO DATE CULTURE WILL BE HELD FOR 5 DAYS BEFORE ISSUING A FINAL NEGATIVE REPORT   Report Status PENDING   Incomplete  URINE CULTURE     Status: None   Collection Time    08/02/12  6:41 PM      Result Value Range Status   Specimen Description URINE, CLEAN CATCH   Final   Special Requests NONE   Final   Culture  Setup Time 08/03/2012 03:38    Final   Colony Count NO GROWTH   Final   Culture NO GROWTH   Final   Report Status 08/04/2012 FINAL   Final     Studies/Results: Dg Chest 2 View  08/04/2012   *RADIOLOGY REPORT*  Clinical Data: Pneumonia  CHEST - 2 VIEW  Comparison: 07/14 and 08/02/2012  Findings: Heart mediastinal contours are unchanged with aortic ectasia and normal heart size. Elevation of the right hemidiaphragm is unchanged.  Linear density at the right lung base and less prominently at the left lung base is most compatible with subsegmental atelectasis. More focal density posteriorly on the right is seen on the lateral view suspicious for an area of focal bronchopneumonia.  These findings are unchanged in comparison with the prior exam.  IMPRESSION: Unchanged cardiopulmonary appearance with bibasilar subsegmental atelectasis and probable posterior right lower lobe pneumonia.   Original Report Authenticated By: Rhodia Albright, M.D.   Ct Chest W Contrast  08/05/2012   *RADIOLOGY REPORT*  Clinical Data:  Fever of unknown origin.  Elevated white cell count.  Pneumonia.  Right lower lobe infiltrates.  CT CHEST, ABDOMEN AND PELVIS WITH CONTRAST  Technique:  Multidetector CT imaging of the chest, abdomen and pelvis was performed following the standard protocol during bolus administration of intravenous contrast.  Contrast: OMNIPAQUE IOHEXOL 300 MG/ML  SOLN, 50mL OMNIPAQUE IOHEXOL 300 MG/ML  SOLN  Comparison:   None.  CT CHEST  Findings:  Normal heart size.  Coronary artery calcifications. Normal caliber thoracic aorta with calcification.  Great vessels are unremarkable.  No evidence of aortic dissection.  No significant lymphadenopathy in the chest.  The esophagus is decompressed.  Small bilateral pleural effusions with atelectasis or consolidation in the lung bases.  Visualization of lung fields is limited due to respiratory motion artifact but no apparent pneumothorax or interstitial changes are identified.  Airways appear patent.   Emphysematous changes and scarring in the apices. Degenerative changes in the thoracic spine.  No destructive bone lesions appreciated.  IMPRESSION: Small bilateral pleural effusions with atelectasis or consolidation in the lung bases.  CT ABDOMEN AND PELVIS  Findings:  There is a large circumscribed loculated hypodense structure involving most of the right lobe of the liver and measuring about 9.9 x 5.9 by 8.7 cm.  There  is mild peripheral enhancement.  Given the history of fevers, this may represent hepatic abscess.  Necrotic mass or metastasis is not excluded.  No bile duct dilatation.  The gallbladder is contracted with mildly thickened wall.  Gallstones are present.  No pericholecystic infiltration.  The pancreas, spleen, adrenal glands, inferior vena cava, and retroperitoneal lymph nodes are unremarkable.  Cysts in the lower pole of the right kidney, largest measuring 3.2 cm diameter.  No evidence of solid mass or hydronephrosis in either kidney.  There is an infrarenal abdominal aortic aneurysm extending to the bifurcation with involvement of the right iliac artery.  The aneurysm measures up to about 4 cm maximal AP diameter by 4.1 cm transverse diameter.  Right iliac artery measures 1.9 cm diameter. There is extensive aortoiliac calcification with mild mural thrombus. No abnormal retroperitoneal fluid collection to suggest rupture.  The stomach is decompressed.  Small bowel are decompressed.  Stool filled colon without distension.  No free air or free fluid in the abdomen.  Abdominal wall musculature appears intact although atrophic.  Pelvis:  Small amount of free fluid in the pelvis.  No loculated fluid collections are identified.  Calcification in the uterus suggesting fibroids.  No abnormal adnexal masses.  The bladder demonstrates diffusely thick walled with diverticula arising from the right lateral wall and the anterior wall of the bladder. Changes suggest cystitis versus hypertrophy due to outlet  obstruction.  The appendix is normal.  There is diverticulosis of the sigmoid colon.  No inflammatory changes to suggest diverticulitis.  There are giant appearing stool filled diverticula present.  These measure up to about 3.2 cm diameter.  No significant pelvic lymphadenopathy.  Focal subcutaneous gas collection in the anterior left abdominal wall likely representing injection site.  Degenerative changes in the lumbar spine with normal alignment.  No destructive bone lesions are appreciated.  IMPRESSION: Lobulated hypodense lesion in the right lobe of the liver likely represents a hepatic abscess versus necrotic tumor.  No evidence of appendicitis or diverticulitis.  The gallbladder is contracted with stones.  Cholecystitis is not excluded.  Extensive diverticulosis of the sigmoid colon.  Abdominal aortic aneurysm measuring up to 4.1 cm maximal AP diameter. Bladder wall thickening with bladder diverticula.   Original Report Authenticated By: Burman Nieves, M.D.   Ct Abdomen Pelvis W Contrast  08/05/2012   *RADIOLOGY REPORT*  Clinical Data:  Fever of unknown origin.  Elevated white cell count.  Pneumonia.  Right lower lobe infiltrates.  CT CHEST, ABDOMEN AND PELVIS WITH CONTRAST  Technique:  Multidetector CT imaging of the chest, abdomen and pelvis was performed following the standard protocol during bolus administration of intravenous contrast.  Contrast: OMNIPAQUE IOHEXOL 300 MG/ML  SOLN, 50mL OMNIPAQUE IOHEXOL 300 MG/ML  SOLN  Comparison:   None.  CT CHEST  Findings:  Normal heart size.  Coronary artery calcifications. Normal caliber thoracic aorta with calcification.  Great vessels are unremarkable.  No evidence of aortic dissection.  No significant lymphadenopathy in the chest.  The esophagus is decompressed.  Small bilateral pleural effusions with atelectasis or consolidation in the lung bases.  Visualization of lung fields is limited due to respiratory motion artifact but no apparent pneumothorax  or interstitial changes are identified.  Airways appear patent.  Emphysematous changes and scarring in the apices. Degenerative changes in the thoracic spine.  No destructive bone lesions appreciated.  IMPRESSION: Small bilateral pleural effusions with atelectasis or consolidation in the lung bases.  CT ABDOMEN AND PELVIS  Findings:  There is a large circumscribed loculated hypodense structure involving most of the right lobe of the liver and measuring about 9.9 x 5.9 by 8.7 cm.  There is mild peripheral enhancement.  Given the history of fevers, this may represent hepatic abscess.  Necrotic mass or metastasis is not excluded.  No bile duct dilatation.  The gallbladder is contracted with mildly thickened wall.  Gallstones are present.  No pericholecystic infiltration.  The pancreas, spleen, adrenal glands, inferior vena cava, and retroperitoneal lymph nodes are unremarkable.  Cysts in the lower pole of the right kidney, largest measuring 3.2 cm diameter.  No evidence of solid mass or hydronephrosis in either kidney.  There is an infrarenal abdominal aortic aneurysm extending to the bifurcation with involvement of the right iliac artery.  The aneurysm measures up to about 4 cm maximal AP diameter by 4.1 cm transverse diameter.  Right iliac artery measures 1.9 cm diameter. There is extensive aortoiliac calcification with mild mural thrombus. No abnormal retroperitoneal fluid collection to suggest rupture.  The stomach is decompressed.  Small bowel are decompressed.  Stool filled colon without distension.  No free air or free fluid in the abdomen.  Abdominal wall musculature appears intact although atrophic.  Pelvis:  Small amount of free fluid in the pelvis.  No loculated fluid collections are identified.  Calcification in the uterus suggesting fibroids.  No abnormal adnexal masses.  The bladder demonstrates diffusely thick walled with diverticula arising from the right lateral wall and the anterior wall of the  bladder. Changes suggest cystitis versus hypertrophy due to outlet obstruction.  The appendix is normal.  There is diverticulosis of the sigmoid colon.  No inflammatory changes to suggest diverticulitis.  There are giant appearing stool filled diverticula present.  These measure up to about 3.2 cm diameter.  No significant pelvic lymphadenopathy.  Focal subcutaneous gas collection in the anterior left abdominal wall likely representing injection site.  Degenerative changes in the lumbar spine with normal alignment.  No destructive bone lesions are appreciated.  IMPRESSION: Lobulated hypodense lesion in the right lobe of the liver likely represents a hepatic abscess versus necrotic tumor.  No evidence of appendicitis or diverticulitis.  The gallbladder is contracted with stones.  Cholecystitis is not excluded.  Extensive diverticulosis of the sigmoid colon.  Abdominal aortic aneurysm measuring up to 4.1 cm maximal AP diameter. Bladder wall thickening with bladder diverticula.   Original Report Authenticated By: Burman Nieves, M.D.    Medications: Scheduled Meds: . heparin  5,000 Units Subcutaneous Q8H  . piperacillin-tazobactam (ZOSYN)  IV  3.375 g Intravenous Q8H  . potassium chloride  40 mEq Oral BID  . sodium chloride  3 mL Intravenous Q12H   Continuous Infusions:   PRN Meds:.sodium chloride, acetaminophen, acetaminophen, albuterol, morphine injection, ondansetron (ZOFRAN) IV, ondansetron, oxyCODONE, sodium chloride    LOS: 4 days   Raya Mckinstry,CHRISTOPHER  [facility] Pager [phone]. If 8PM-8AM, please contact night-coverage at [email or web address], password [facility] 08/06/2012, 10:36 AM  LOS: 4 days

## 2012-08-06 NOTE — Progress Notes (Signed)
Patient ID: Sydney Scott, female   DOB: 21-Jun-1927, 79 y.o.   MRN: [id number] Request received for CT guided drainage of loculated hepatic fluid coll/abscess on pt with persistent fevers/leukocytosis, recent UTI/PNA . Imaging studies were reviewed by Dr. Fredia Sorrow. Additional PMH as below. Exam: pt awake/alert; chest- sl dim BS bases; heart- RRR; abd- soft,+BS, mildly tender RUQ; ext- FROM, no edema.   Filed Vitals:   08/05/12 0630 08/05/12 1444 08/05/12 2111 08/06/12 0457  BP: 135/75 149/80 151/56 143/79  Pulse: 78 94 96 91  Temp: 97.8 F (36.6 C) 100.7 F (38.2 C) 98.8 F (37.1 C) 98.8 F (37.1 C)  TempSrc: Oral Oral Oral Oral  Resp: 20 18 20 20   Height:      Weight:      SpO2: 96% 94% 98% 97%   Past Medical History  Diagnosis Date  . Arthritis    Past Surgical History  Procedure Laterality Date  . No past surgeries     Dg Chest 2 View  08/04/2012   *RADIOLOGY REPORT*  Clinical Data: Pneumonia  CHEST - 2 VIEW  Comparison: 07/14 and 08/02/2012  Findings: Heart mediastinal contours are unchanged with aortic ectasia and normal heart size. Elevation of the right hemidiaphragm is unchanged.  Linear density at the right lung base and less prominently at the left lung base is most compatible with subsegmental atelectasis. More focal density posteriorly on the right is seen on the lateral view suspicious for an area of focal bronchopneumonia.  These findings are unchanged in comparison with the prior exam.  IMPRESSION: Unchanged cardiopulmonary appearance with bibasilar subsegmental atelectasis and probable posterior right lower lobe pneumonia.   Original Report Authenticated By: Rhodia Albright, M.D.   Dg Chest 2 View  08/02/2012   *RADIOLOGY REPORT*  Clinical Data: Weakness  CHEST - 2 VIEW  Comparison: 07/27/2012  Findings: Small right pleural effusion.  Associated right basilar opacity, atelectasis versus pneumonia.  Stable mild elevation of the right hemidiaphragm.  The heart is normal in  size.  Visualized osseous structures are within normal limits.  IMPRESSION: Small right pleural effusion.  Associated right basilar opacity, atelectasis versus pneumonia.   Original Report Authenticated By: Charline Bills, M.D.   Dg Chest 2 View  07/27/2012   *RADIOLOGY REPORT*  Clinical Data: Fever, weakness, loss of appetite  CHEST - 2 VIEW  Comparison: None.  Findings: Cardiomediastinal silhouette is unremarkable.  Mild elevation of the right hemidiaphragm with right basilar atelectasis.  No segmental infiltrate or pulmonary edema.  IMPRESSION: Mild elevation of the right hemidiaphragm with right basilar atelectasis.  No segmental infiltrate or pulmonary edema.   Original Report Authenticated By: Natasha Mead, M.D.   Ct Chest W Contrast  08/05/2012   *RADIOLOGY REPORT*  Clinical Data:  Fever of unknown origin.  Elevated white cell count.  Pneumonia.  Right lower lobe infiltrates.  CT CHEST, ABDOMEN AND PELVIS WITH CONTRAST  Technique:  Multidetector CT imaging of the chest, abdomen and pelvis was performed following the standard protocol during bolus administration of intravenous contrast.  Contrast: OMNIPAQUE IOHEXOL 300 MG/ML  SOLN, 50mL OMNIPAQUE IOHEXOL 300 MG/ML  SOLN  Comparison:   None.  CT CHEST  Findings:  Normal heart size.  Coronary artery calcifications. Normal caliber thoracic aorta with calcification.  Great vessels are unremarkable.  No evidence of aortic dissection.  No significant lymphadenopathy in the chest.  The esophagus is decompressed.  Small bilateral pleural effusions with atelectasis or consolidation in the lung bases.  Visualization of lung  fields is limited due to respiratory motion artifact but no apparent pneumothorax or interstitial changes are identified.  Airways appear patent.  Emphysematous changes and scarring in the apices. Degenerative changes in the thoracic spine.  No destructive bone lesions appreciated.  IMPRESSION: Small bilateral pleural effusions with  atelectasis or consolidation in the lung bases.  CT ABDOMEN AND PELVIS  Findings:  There is a large circumscribed loculated hypodense structure involving most of the right lobe of the liver and measuring about 9.9 x 5.9 by 8.7 cm.  There is mild peripheral enhancement.  Given the history of fevers, this may represent hepatic abscess.  Necrotic mass or metastasis is not excluded.  No bile duct dilatation.  The gallbladder is contracted with mildly thickened wall.  Gallstones are present.  No pericholecystic infiltration.  The pancreas, spleen, adrenal glands, inferior vena cava, and retroperitoneal lymph nodes are unremarkable.  Cysts in the lower pole of the right kidney, largest measuring 3.2 cm diameter.  No evidence of solid mass or hydronephrosis in either kidney.  There is an infrarenal abdominal aortic aneurysm extending to the bifurcation with involvement of the right iliac artery.  The aneurysm measures up to about 4 cm maximal AP diameter by 4.1 cm transverse diameter.  Right iliac artery measures 1.9 cm diameter. There is extensive aortoiliac calcification with mild mural thrombus. No abnormal retroperitoneal fluid collection to suggest rupture.  The stomach is decompressed.  Small bowel are decompressed.  Stool filled colon without distension.  No free air or free fluid in the abdomen.  Abdominal wall musculature appears intact although atrophic.  Pelvis:  Small amount of free fluid in the pelvis.  No loculated fluid collections are identified.  Calcification in the uterus suggesting fibroids.  No abnormal adnexal masses.  The bladder demonstrates diffusely thick walled with diverticula arising from the right lateral wall and the anterior wall of the bladder. Changes suggest cystitis versus hypertrophy due to outlet obstruction.  The appendix is normal.  There is diverticulosis of the sigmoid colon.  No inflammatory changes to suggest diverticulitis.  There are giant appearing stool filled diverticula  present.  These measure up to about 3.2 cm diameter.  No significant pelvic lymphadenopathy.  Focal subcutaneous gas collection in the anterior left abdominal wall likely representing injection site.  Degenerative changes in the lumbar spine with normal alignment.  No destructive bone lesions are appreciated.  IMPRESSION: Lobulated hypodense lesion in the right lobe of the liver likely represents a hepatic abscess versus necrotic tumor.  No evidence of appendicitis or diverticulitis.  The gallbladder is contracted with stones.  Cholecystitis is not excluded.  Extensive diverticulosis of the sigmoid colon.  Abdominal aortic aneurysm measuring up to 4.1 cm maximal AP diameter. Bladder wall thickening with bladder diverticula.   Original Report Authenticated By: Burman Nieves, M.D.   Ct Abdomen Pelvis W Contrast  08/05/2012   *RADIOLOGY REPORT*  Clinical Data:  Fever of unknown origin.  Elevated white cell count.  Pneumonia.  Right lower lobe infiltrates.  CT CHEST, ABDOMEN AND PELVIS WITH CONTRAST  Technique:  Multidetector CT imaging of the chest, abdomen and pelvis was performed following the standard protocol during bolus administration of intravenous contrast.  Contrast: OMNIPAQUE IOHEXOL 300 MG/ML  SOLN, 50mL OMNIPAQUE IOHEXOL 300 MG/ML  SOLN  Comparison:   None.  CT CHEST  Findings:  Normal heart size.  Coronary artery calcifications. Normal caliber thoracic aorta with calcification.  Great vessels are unremarkable.  No evidence of aortic dissection.  No significant lymphadenopathy in the chest.  The esophagus is decompressed.  Small bilateral pleural effusions with atelectasis or consolidation in the lung bases.  Visualization of lung fields is limited due to respiratory motion artifact but no apparent pneumothorax or interstitial changes are identified.  Airways appear patent.  Emphysematous changes and scarring in the apices. Degenerative changes in the thoracic spine.  No destructive bone lesions  appreciated.  IMPRESSION: Small bilateral pleural effusions with atelectasis or consolidation in the lung bases.  CT ABDOMEN AND PELVIS  Findings:  There is a large circumscribed loculated hypodense structure involving most of the right lobe of the liver and measuring about 9.9 x 5.9 by 8.7 cm.  There is mild peripheral enhancement.  Given the history of fevers, this may represent hepatic abscess.  Necrotic mass or metastasis is not excluded.  No bile duct dilatation.  The gallbladder is contracted with mildly thickened wall.  Gallstones are present.  No pericholecystic infiltration.  The pancreas, spleen, adrenal glands, inferior vena cava, and retroperitoneal lymph nodes are unremarkable.  Cysts in the lower pole of the right kidney, largest measuring 3.2 cm diameter.  No evidence of solid mass or hydronephrosis in either kidney.  There is an infrarenal abdominal aortic aneurysm extending to the bifurcation with involvement of the right iliac artery.  The aneurysm measures up to about 4 cm maximal AP diameter by 4.1 cm transverse diameter.  Right iliac artery measures 1.9 cm diameter. There is extensive aortoiliac calcification with mild mural thrombus. No abnormal retroperitoneal fluid collection to suggest rupture.  The stomach is decompressed.  Small bowel are decompressed.  Stool filled colon without distension.  No free air or free fluid in the abdomen.  Abdominal wall musculature appears intact although atrophic.  Pelvis:  Small amount of free fluid in the pelvis.  No loculated fluid collections are identified.  Calcification in the uterus suggesting fibroids.  No abnormal adnexal masses.  The bladder demonstrates diffusely thick walled with diverticula arising from the right lateral wall and the anterior wall of the bladder. Changes suggest cystitis versus hypertrophy due to outlet obstruction.  The appendix is normal.  There is diverticulosis of the sigmoid colon.  No inflammatory changes to suggest  diverticulitis.  There are giant appearing stool filled diverticula present.  These measure up to about 3.2 cm diameter.  No significant pelvic lymphadenopathy.  Focal subcutaneous gas collection in the anterior left abdominal wall likely representing injection site.  Degenerative changes in the lumbar spine with normal alignment.  No destructive bone lesions are appreciated.  IMPRESSION: Lobulated hypodense lesion in the right lobe of the liver likely represents a hepatic abscess versus necrotic tumor.  No evidence of appendicitis or diverticulitis.  The gallbladder is contracted with stones.  Cholecystitis is not excluded.  Extensive diverticulosis of the sigmoid colon.  Abdominal aortic aneurysm measuring up to 4.1 cm maximal AP diameter. Bladder wall thickening with bladder diverticula.   Original Report Authenticated By: Burman Nieves, M.D.  Results for orders placed during the hospital encounter of 08/02/12  URINE CULTURE      Result Value Range   Specimen Description URINE, CLEAN CATCH     Special Requests NONE     Culture  Setup Time 08/03/2012 03:38     Colony Count NO GROWTH     Culture NO GROWTH     Report Status 08/04/2012 FINAL    CULTURE, BLOOD (ROUTINE X 2)      Result Value Range   Specimen Description  BLOOD RIGHT HAND     Special Requests NONE     Culture  Setup Time 08/02/2012 20:54     Culture       Value:        BLOOD CULTURE RECEIVED NO GROWTH TO DATE CULTURE WILL BE HELD FOR 5 DAYS BEFORE ISSUING A FINAL NEGATIVE REPORT   Report Status PENDING    CULTURE, BLOOD (ROUTINE X 2)      Result Value Range   Specimen Description BLOOD LEFT ANTECUBITAL     Special Requests BOTTLES DRAWN AEROBIC AND ANAEROBIC 4CC     Culture  Setup Time 08/02/2012 20:47     Culture       Value:        BLOOD CULTURE RECEIVED NO GROWTH TO DATE CULTURE WILL BE HELD FOR 5 DAYS BEFORE ISSUING A FINAL NEGATIVE REPORT   Report Status PENDING    CBC WITH DIFFERENTIAL      Result Value Range   WBC  19.3 (*) 4.0 - 10.5 K/uL   RBC 3.57 (*) 3.87 - 5.11 MIL/uL   Hemoglobin 10.1 (*) 12.0 - 15.0 g/dL   HCT 96.0 (*) 45.4 - 09.8 %   MCV 77.9 (*) 78.0 - 100.0 fL   MCH 28.3  26.0 - 34.0 pg   MCHC 36.3 (*) 30.0 - 36.0 g/dL   RDW 11.9  14.7 - 82.9 %   Platelets 302  150 - 400 K/uL   Neutrophils Relative % 86 (*) 43 - 77 %   Neutro Abs 16.6 (*) 1.7 - 7.7 K/uL   Lymphocytes Relative 10 (*) 12 - 46 %   Lymphs Abs 1.9  0.7 - 4.0 K/uL   Monocytes Relative 4  3 - 12 %   Monocytes Absolute 0.8  0.1 - 1.0 K/uL   Eosinophils Relative 0  0 - 5 %   Eosinophils Absolute 0.0  0.0 - 0.7 K/uL   Basophils Relative 0  0 - 1 %   Basophils Absolute 0.0  0.0 - 0.1 K/uL  BASIC METABOLIC PANEL      Result Value Range   Sodium 136  135 - 145 mEq/L   Potassium 2.7 (*) 3.5 - 5.1 mEq/L   Chloride 98  96 - 112 mEq/L   CO2 27  19 - 32 mEq/L   Glucose, Bld 112 (*) 70 - 99 mg/dL   BUN 9  6 - 23 mg/dL   Creatinine, Ser 5.62  0.50 - 1.10 mg/dL   Calcium 8.3 (*) 8.4 - 10.5 mg/dL   GFR calc non Af Amer 82 (*) >90 mL/min   GFR calc Af Amer >90  >90 mL/min  LACTIC ACID, PLASMA      Result Value Range   Lactic Acid, Venous 1.6  0.5 - 2.2 mmol/L  URINALYSIS, ROUTINE W REFLEX MICROSCOPIC      Result Value Range   Color, Urine AMBER (*) YELLOW   APPearance CLOUDY (*) CLEAR   Specific Gravity, Urine 1.017  1.005 - 1.030   pH 6.0  5.0 - 8.0   Glucose, UA NEGATIVE  NEGATIVE mg/dL   Hgb urine dipstick TRACE (*) NEGATIVE   Bilirubin Urine SMALL (*) NEGATIVE   Ketones, ur 15 (*) NEGATIVE mg/dL   Protein, ur 30 (*) NEGATIVE mg/dL   Urobilinogen, UA 1.0  0.0 - 1.0 mg/dL   Nitrite NEGATIVE  NEGATIVE   Leukocytes, UA LARGE (*) NEGATIVE  CBC      Result Value Range   WBC  18.4 (*) 4.0 - 10.5 K/uL   RBC 3.28 (*) 3.87 - 5.11 MIL/uL   Hemoglobin 9.4 (*) 12.0 - 15.0 g/dL   HCT 45.4 (*) 09.8 - 11.9 %   MCV 78.0  78.0 - 100.0 fL   MCH 28.7  26.0 - 34.0 pg   MCHC 36.7 (*) 30.0 - 36.0 g/dL   RDW 14.7  82.9 - 56.2 %    Platelets 257  150 - 400 K/uL  COMPREHENSIVE METABOLIC PANEL      Result Value Range   Sodium 136  135 - 145 mEq/L   Potassium 3.3 (*) 3.5 - 5.1 mEq/L   Chloride 103  96 - 112 mEq/L   CO2 23  19 - 32 mEq/L   Glucose, Bld 103 (*) 70 - 99 mg/dL   BUN 6  6 - 23 mg/dL   Creatinine, Ser 1.30  0.50 - 1.10 mg/dL   Calcium 7.8 (*) 8.4 - 10.5 mg/dL   Total Protein 5.4 (*) 6.0 - 8.3 g/dL   Albumin 1.5 (*) 3.5 - 5.2 g/dL   AST 865 (*) 0 - 37 U/L   ALT 58 (*) 0 - 35 U/L   Alkaline Phosphatase 205 (*) 39 - 117 U/L   Total Bilirubin 2.3 (*) 0.3 - 1.2 mg/dL   GFR calc non Af Amer 83 (*) >90 mL/min   GFR calc Af Amer >90  >90 mL/min  MAGNESIUM      Result Value Range   Magnesium 2.0  1.5 - 2.5 mg/dL  TSH      Result Value Range   TSH 1.214  0.350 - 4.500 uIU/mL  VITAMIN B12      Result Value Range   Vitamin B-12 1079 (*) 211 - 911 pg/mL  FOLATE      Result Value Range   Folate 16.3    IRON AND TIBC      Result Value Range   Iron 20 (*) 42 - 135 ug/dL   TIBC 784 (*) 696 - 295 ug/dL   Saturation Ratios 10 (*) 20 - 55 %   UIBC 172  125 - 400 ug/dL  FERRITIN      Result Value Range   Ferritin 457 (*) 10 - 291 ng/mL  POTASSIUM      Result Value Range   Potassium 3.1 (*) 3.5 - 5.1 mEq/L  URINE MICROSCOPIC-ADD ON      Result Value Range   Squamous Epithelial / LPF MANY (*) RARE   WBC, UA TOO NUMEROUS TO COUNT  <3 WBC/hpf   RBC / HPF 0-2  <3 RBC/hpf   Bacteria, UA FEW (*) RARE   Urine-Other TRICHOMONAS PRESENT    HIV ANTIBODY (ROUTINE TESTING)      Result Value Range   HIV NON REACTIVE  NON REACTIVE  RPR      Result Value Range   RPR NON REACTIVE  NON REACTIVE  CBC      Result Value Range   WBC 19.5 (*) 4.0 - 10.5 K/uL   RBC 3.19 (*) 3.87 - 5.11 MIL/uL   Hemoglobin 9.1 (*) 12.0 - 15.0 g/dL   HCT 28.4 (*) 13.2 - 44.0 %   MCV 77.7 (*) 78.0 - 100.0 fL   MCH 28.5  26.0 - 34.0 pg   MCHC 36.7 (*) 30.0 - 36.0 g/dL   RDW 10.2  72.5 - 36.6 %   Platelets 345  150 - 400 K/uL  BASIC  METABOLIC PANEL  Result Value Range   Sodium 137  135 - 145 mEq/L   Potassium 3.3 (*) 3.5 - 5.1 mEq/L   Chloride 103  96 - 112 mEq/L   CO2 24  19 - 32 mEq/L   Glucose, Bld 116 (*) 70 - 99 mg/dL   BUN 5 (*) 6 - 23 mg/dL   Creatinine, Ser 1.61  0.50 - 1.10 mg/dL   Calcium 7.9 (*) 8.4 - 10.5 mg/dL   GFR calc non Af Amer 83 (*) >90 mL/min   GFR calc Af Amer >90  >90 mL/min  VANCOMYCIN, TROUGH      Result Value Range   Vancomycin Tr <5.0 (*) 10.0 - 20.0 ug/mL  CBC      Result Value Range   WBC 17.9 (*) 4.0 - 10.5 K/uL   RBC 3.32 (*) 3.87 - 5.11 MIL/uL   Hemoglobin 9.3 (*) 12.0 - 15.0 g/dL   HCT 09.6 (*) 04.5 - 40.9 %   MCV 78.3  78.0 - 100.0 fL   MCH 28.0  26.0 - 34.0 pg   MCHC 35.8  30.0 - 36.0 g/dL   RDW 81.1  91.4 - 78.2 %   Platelets 344  150 - 400 K/uL  BASIC METABOLIC PANEL      Result Value Range   Sodium 137  135 - 145 mEq/L   Potassium 3.1 (*) 3.5 - 5.1 mEq/L   Chloride 105  96 - 112 mEq/L   CO2 24  19 - 32 mEq/L   Glucose, Bld 107 (*) 70 - 99 mg/dL   BUN 4 (*) 6 - 23 mg/dL   Creatinine, Ser 9.56  0.50 - 1.10 mg/dL   Calcium 8.0 (*) 8.4 - 10.5 mg/dL   GFR calc non Af Amer 82 (*) >90 mL/min   GFR calc Af Amer >90  >90 mL/min  CBC      Result Value Range   WBC 17.9 (*) 4.0 - 10.5 K/uL   RBC 3.10 (*) 3.87 - 5.11 MIL/uL   Hemoglobin 8.8 (*) 12.0 - 15.0 g/dL   HCT 21.3 (*) 08.6 - 57.8 %   MCV 78.4  78.0 - 100.0 fL   MCH 28.4  26.0 - 34.0 pg   MCHC 36.2 (*) 30.0 - 36.0 g/dL   RDW 46.9  62.9 - 52.8 %   Platelets 320  150 - 400 K/uL  BASIC METABOLIC PANEL      Result Value Range   Sodium 136  135 - 145 mEq/L   Potassium 3.9  3.5 - 5.1 mEq/L   Chloride 103  96 - 112 mEq/L   CO2 24  19 - 32 mEq/L   Glucose, Bld 106 (*) 70 - 99 mg/dL   BUN 4 (*) 6 - 23 mg/dL   Creatinine, Ser 4.13  0.50 - 1.10 mg/dL   Calcium 8.2 (*) 8.4 - 10.5 mg/dL   GFR calc non Af Amer 82 (*) >90 mL/min   GFR calc Af Amer >90  >90 mL/min   A/P: Pt with fevers, leukocytosis ,recent  UTI/PNA  and  CT abd revealing probable hepatic abscess in right lobe. Plan is for CT guided drainage of abscess today. Details/risks of procedure d/w pt with her understanding and consent.

## 2012-08-06 NOTE — Procedures (Signed)
Procedure:  CT guided drainage of hepatic abscess Findings:  Aspiration of purulent fluid.  Sample sent for culture studies.  12 Fr drain placed and attached to suction bulb.  Will follow output.

## 2012-08-06 NOTE — Progress Notes (Signed)
Comment:  Dr Daphine Deutscher provided surgical consultation, and recommended CT-Guided aspiration/drainage by IR. Dr Irish Lack provided IR consultation, performed this procedure on 08/06/12, with aspiration of purulent fluid. Sample sent for culture studies. 12 French drain placed and attached to suction bulb. IR team will follow output.  C. Dianne Whelchel. MD, FACP.

## 2012-08-07 DIAGNOSIS — K750 Abscess of liver: Secondary | ICD-10-CM

## 2012-08-07 LAB — COMPREHENSIVE METABOLIC PANEL
ALT: 19 U/L (ref 0–35)
AST: 23 U/L (ref 0–37)
Albumin: 1.7 g/dL — ABNORMAL LOW (ref 3.5–5.2)
Alkaline Phosphatase: 164 U/L — ABNORMAL HIGH (ref 39–117)
BUN: 6 mg/dL (ref 6–23)
Potassium: 4.5 mEq/L (ref 3.5–5.1)
Sodium: 139 mEq/L (ref 135–145)
Total Protein: 5.9 g/dL — ABNORMAL LOW (ref 6.0–8.3)

## 2012-08-07 LAB — CBC
MCHC: 36.3 g/dL — ABNORMAL HIGH (ref 30.0–36.0)
Platelets: 387 10*3/uL (ref 150–400)
RDW: 14.8 % (ref 11.5–15.5)

## 2012-08-07 NOTE — Progress Notes (Signed)
ANTIBIOTIC CONSULT NOTE - FOLLOW UP  Pharmacy Consult for Zosyn Indication: pneumonia, hepatic abscess, bacteremia  Allergies  Allergen Reactions  . Aspirin Nausea And Vomiting    Patient Measurements: Height: 5\' 4"  (162.6 cm) Weight: 153 lb (69.4 kg) IBW/kg (Calculated) : 54.7  Vital Signs: Temp: 98.5 F (36.9 C) (07/25 0547) Temp src: Oral (07/25 0547) BP: 123/73 mmHg (07/25 0547) Pulse Rate: 87 (07/25 0547) Intake/Output from previous day: 07/24 0701 - 07/25 0700 In: 620 [P.O.:600] Out: 255 [Drains:80]  Labs:  Recent Labs  08/05/12 0515 08/06/12 0455 08/07/12 0505  WBC 17.9* 17.9* 17.5*  HGB 9.3* 8.8* 8.9*  PLT 344 320 387  CREATININE 0.59 0.60 0.56   Estimated Creatinine Clearance: 51 ml/min (by C-G formula based on Cr of 0.56).  Recent Labs  08/04/12 1710  VANCOTROUGH <5.0*     Microbiology: 7/14 blood x3: microaerophili strep 7/20 blood x2: NGTD 7/20 urine: NG 7/24 Liver abscess culture: in process.   Anti-infectives: (Rocephin x1 7/14, then Cipro 7/15 - 7/20) 7/20 >> Vanc >> 7/22 7/20 >> Zosyn >>  7/21 >> Flagyl >> 7/21  Assessment: 37 yof admitted 7/20 with presumed UTI and pneumonia. She was recently admitted and received one dose of Rocephin, then Cipro PO as an outpatient. Pharmacy was asked to dose Vancomycin and Zosyn for HCAP and newly discovered hepatic lesion (r/o abscess, necrotic mass).  Day #6 Zosyn, Vancomycin d/c after 2 days of therapy  WBC remain elevated, 17.5  Tm 102.8, currently 98.5  SCr is stable with CrCl ~ 51 ml/min.  7/14 BCx w/ microarophilic streptococcus, repeat BCx from 7/20 are no growth to date.  7/24 Liver abscess culture is in process.  Goal of Therapy:  Appropriate abx dosing, eradication of infection.   Plan:   Continue Zosyn 3.375g IV Q8H infused over 4hrs.   Follow up renal function and cultures as available.  Lynann Beaver PharmD, BCPS Pager [phone] 08/07/2012 2:20 PM

## 2012-08-07 NOTE — Progress Notes (Signed)
Subjective: Pt doing well; only c/o is mild RUQ soreness at drain site  Objective: Vital signs in last 24 hours: Temp:  [98.4 F (36.9 C)-102.8 F (39.3 C)] 98.5 F (36.9 C) (07/25 0547) Pulse Rate:  [86-89] 87 (07/25 0547) Resp:  [17-36] 20 (07/25 0547) BP: (123-143)/(68-76) 123/73 mmHg (07/25 0547) SpO2:  [94 %-99 %] 96 % (07/25 0547) Last BM Date: 08/06/12  Intake/Output from previous day: 07/24 0701 - 07/25 0700 In: 620 [P.O.:600] Out: 255 [Drains:80] Intake/Output this shift:    Hepatic drain intact, output 80 cc's serous fluid today, cx's pending; insertion site mildly tender  Lab Results:   Recent Labs  08/06/12 0455 08/07/12 0505  WBC 17.9* 17.5*  HGB 8.8* 8.9*  HCT 24.3* 24.5*  PLT 320 387   BMET  Recent Labs  08/06/12 0455 08/07/12 0505  NA 136 139  K 3.9 4.5  CL 103 106  CO2 24 25  GLUCOSE 106* 110*  BUN 4* 6  CREATININE 0.60 0.56  CALCIUM 8.2* 8.5   PT/INR No results found for this basename: LABPROT, INR,  in the last 72 hours ABG No results found for this basename: PHART, PCO2, PO2, HCO3,  in the last 72 hours  Studies/Results: Ct Chest W Contrast  08/05/2012   *RADIOLOGY REPORT*  Clinical Data:  Fever of unknown origin.  Elevated white cell count.  Pneumonia.  Right lower lobe infiltrates.  CT CHEST, ABDOMEN AND PELVIS WITH CONTRAST  Technique:  Multidetector CT imaging of the chest, abdomen and pelvis was performed following the standard protocol during bolus administration of intravenous contrast.  Contrast: OMNIPAQUE IOHEXOL 300 MG/ML  SOLN, 50mL OMNIPAQUE IOHEXOL 300 MG/ML  SOLN  Comparison:   None.  CT CHEST  Findings:  Normal heart size.  Coronary artery calcifications. Normal caliber thoracic aorta with calcification.  Great vessels are unremarkable.  No evidence of aortic dissection.  No significant lymphadenopathy in the chest.  The esophagus is decompressed.  Small bilateral pleural effusions with atelectasis or consolidation  in the lung bases.  Visualization of lung fields is limited due to respiratory motion artifact but no apparent pneumothorax or interstitial changes are identified.  Airways appear patent.  Emphysematous changes and scarring in the apices. Degenerative changes in the thoracic spine.  No destructive bone lesions appreciated.  IMPRESSION: Small bilateral pleural effusions with atelectasis or consolidation in the lung bases.  CT ABDOMEN AND PELVIS  Findings:  There is a large circumscribed loculated hypodense structure involving most of the right lobe of the liver and measuring about 9.9 x 5.9 by 8.7 cm.  There is mild peripheral enhancement.  Given the history of fevers, this may represent hepatic abscess.  Necrotic mass or metastasis is not excluded.  No bile duct dilatation.  The gallbladder is contracted with mildly thickened wall.  Gallstones are present.  No pericholecystic infiltration.  The pancreas, spleen, adrenal glands, inferior vena cava, and retroperitoneal lymph nodes are unremarkable.  Cysts in the lower pole of the right kidney, largest measuring 3.2 cm diameter.  No evidence of solid mass or hydronephrosis in either kidney.  There is an infrarenal abdominal aortic aneurysm extending to the bifurcation with involvement of the right iliac artery.  The aneurysm measures up to about 4 cm maximal AP diameter by 4.1 cm transverse diameter.  Right iliac artery measures 1.9 cm diameter. There is extensive aortoiliac calcification with mild mural thrombus. No abnormal retroperitoneal fluid collection to suggest rupture.  The stomach is decompressed.  Small  bowel are decompressed.  Stool filled colon without distension.  No free air or free fluid in the abdomen.  Abdominal wall musculature appears intact although atrophic.  Pelvis:  Small amount of free fluid in the pelvis.  No loculated fluid collections are identified.  Calcification in the uterus suggesting fibroids.  No abnormal adnexal masses.  The bladder  demonstrates diffusely thick walled with diverticula arising from the right lateral wall and the anterior wall of the bladder. Changes suggest cystitis versus hypertrophy due to outlet obstruction.  The appendix is normal.  There is diverticulosis of the sigmoid colon.  No inflammatory changes to suggest diverticulitis.  There are giant appearing stool filled diverticula present.  These measure up to about 3.2 cm diameter.  No significant pelvic lymphadenopathy.  Focal subcutaneous gas collection in the anterior left abdominal wall likely representing injection site.  Degenerative changes in the lumbar spine with normal alignment.  No destructive bone lesions are appreciated.  IMPRESSION: Lobulated hypodense lesion in the right lobe of the liver likely represents a hepatic abscess versus necrotic tumor.  No evidence of appendicitis or diverticulitis.  The gallbladder is contracted with stones.  Cholecystitis is not excluded.  Extensive diverticulosis of the sigmoid colon.  Abdominal aortic aneurysm measuring up to 4.1 cm maximal AP diameter. Bladder wall thickening with bladder diverticula.   Original Report Authenticated By: Burman Nieves, M.D.   Ct Abdomen Pelvis W Contrast  08/05/2012   *RADIOLOGY REPORT*  Clinical Data:  Fever of unknown origin.  Elevated white cell count.  Pneumonia.  Right lower lobe infiltrates.  CT CHEST, ABDOMEN AND PELVIS WITH CONTRAST  Technique:  Multidetector CT imaging of the chest, abdomen and pelvis was performed following the standard protocol during bolus administration of intravenous contrast.  Contrast: OMNIPAQUE IOHEXOL 300 MG/ML  SOLN, 50mL OMNIPAQUE IOHEXOL 300 MG/ML  SOLN  Comparison:   None.  CT CHEST  Findings:  Normal heart size.  Coronary artery calcifications. Normal caliber thoracic aorta with calcification.  Great vessels are unremarkable.  No evidence of aortic dissection.  No significant lymphadenopathy in the chest.  The esophagus is decompressed.  Small  bilateral pleural effusions with atelectasis or consolidation in the lung bases.  Visualization of lung fields is limited due to respiratory motion artifact but no apparent pneumothorax or interstitial changes are identified.  Airways appear patent.  Emphysematous changes and scarring in the apices. Degenerative changes in the thoracic spine.  No destructive bone lesions appreciated.  IMPRESSION: Small bilateral pleural effusions with atelectasis or consolidation in the lung bases.  CT ABDOMEN AND PELVIS  Findings:  There is a large circumscribed loculated hypodense structure involving most of the right lobe of the liver and measuring about 9.9 x 5.9 by 8.7 cm.  There is mild peripheral enhancement.  Given the history of fevers, this may represent hepatic abscess.  Necrotic mass or metastasis is not excluded.  No bile duct dilatation.  The gallbladder is contracted with mildly thickened wall.  Gallstones are present.  No pericholecystic infiltration.  The pancreas, spleen, adrenal glands, inferior vena cava, and retroperitoneal lymph nodes are unremarkable.  Cysts in the lower pole of the right kidney, largest measuring 3.2 cm diameter.  No evidence of solid mass or hydronephrosis in either kidney.  There is an infrarenal abdominal aortic aneurysm extending to the bifurcation with involvement of the right iliac artery.  The aneurysm measures up to about 4 cm maximal AP diameter by 4.1 cm transverse diameter.  Right iliac artery  measures 1.9 cm diameter. There is extensive aortoiliac calcification with mild mural thrombus. No abnormal retroperitoneal fluid collection to suggest rupture.  The stomach is decompressed.  Small bowel are decompressed.  Stool filled colon without distension.  No free air or free fluid in the abdomen.  Abdominal wall musculature appears intact although atrophic.  Pelvis:  Small amount of free fluid in the pelvis.  No loculated fluid collections are identified.  Calcification in the uterus  suggesting fibroids.  No abnormal adnexal masses.  The bladder demonstrates diffusely thick walled with diverticula arising from the right lateral wall and the anterior wall of the bladder. Changes suggest cystitis versus hypertrophy due to outlet obstruction.  The appendix is normal.  There is diverticulosis of the sigmoid colon.  No inflammatory changes to suggest diverticulitis.  There are giant appearing stool filled diverticula present.  These measure up to about 3.2 cm diameter.  No significant pelvic lymphadenopathy.  Focal subcutaneous gas collection in the anterior left abdominal wall likely representing injection site.  Degenerative changes in the lumbar spine with normal alignment.  No destructive bone lesions are appreciated.  IMPRESSION: Lobulated hypodense lesion in the right lobe of the liver likely represents a hepatic abscess versus necrotic tumor.  No evidence of appendicitis or diverticulitis.  The gallbladder is contracted with stones.  Cholecystitis is not excluded.  Extensive diverticulosis of the sigmoid colon.  Abdominal aortic aneurysm measuring up to 4.1 cm maximal AP diameter. Bladder wall thickening with bladder diverticula.   Original Report Authenticated By: Burman Nieves, M.D.   Ct Image Guided Drainage By Percutaneous Catheter  08/06/2012   *RADIOLOGY REPORT*  Clinical Data: Hepatic abscess with fever and leukocytosis.  CT GUIDED DRAINAGE OF HEPATIC ABSCESS  Sedation:  1.5 mg IV Versed;  100 mcg IV Fentanyl  Total Moderate Sedation Time: 14 minutes.  Procedure:  The procedure, risks, benefits, and alternatives were explained to the patient.  Questions regarding the procedure were encouraged and answered. The patient understands and consents to the procedure.  The right abdominal wall was prepped with Betadine in a sterile fashion, and a sterile drape was applied covering the operative field.  A sterile gown and sterile gloves were used for the procedure. Local anesthesia was  provided with 1% Lidocaine.  The CT was performed in a supine and slightly oblique position with the right side rolled up.  After localizing a large hepatic abscess, an 18 gauge needle was advanced into the collection. Aspiration of fluid sample was performed with the sample sent for culture analysis.  A guidewire was advanced into the collection.  The tract was dilated and a 12-French drainage catheter placed in the collection.  The catheter was connected to a suction bulb.  It was secured at the skin with a Prolene retention suture and Stat- Lock device.  Complications: None  Findings: Aspiration revealed grossly purulent fluid.  The percutaneous drain was positioned in the dominant liquefied pocket posteriorly.  There is excellent drainage of fluid from the drain after placement.  IMPRESSION: CT guided drainage of hepatic abscess.  A grossly purulent fluid sample was sent for culture analysis.  A 12-French drain was placed in the abscess.  This was connected to a suction bulb.  Output will be followed.   Original Report Authenticated By: Irish Lack, M.D.    Anti-infectives: Anti-infectives   Start     Dose/Rate Route Frequency Ordered Stop   08/04/12 0000  vancomycin (VANCOCIN) IVPB 1000 mg/200 mL premix  Status:  Discontinued  1,000 mg 200 mL/hr over 60 Minutes Intravenous Every 12 hours 08/04/12 1920 08/04/12 2037   08/03/12 2200  metroNIDAZOLE (FLAGYL) tablet 1,000 mg     1,000 mg Oral  Once 08/03/12 1159 08/03/12 2150   08/03/12 1000  metroNIDAZOLE (FLAGYL) tablet 1,000 mg     1,000 mg Oral  Once 08/03/12 0901 08/03/12 0942   08/03/12 0200  piperacillin-tazobactam (ZOSYN) IVPB 3.375 g     3.375 g 12.5 mL/hr over 240 Minutes Intravenous Every 8 hours 08/02/12 1710     08/02/12 1800  vancomycin (VANCOCIN) 500 mg in sodium chloride 0.9 % 100 mL IVPB     500 mg 100 mL/hr over 60 Minutes Intravenous Every 12 hours 08/02/12 1710 08/04/12 1931   08/02/12 1700  piperacillin-tazobactam  (ZOSYN) IVPB 3.375 g     3.375 g 100 mL/hr over 30 Minutes Intravenous  Once 08/02/12 1656 08/02/12 1859      Assessment/Plan: S/p rt lobe hepatic abscess drainage 7/24; check final cx's; also send fluid for cytology; monitor labs, irrigate drain to expedite output; check f/u CT  In 1 week  LOS: 5 days    Shahla Betsill,D [facility] 08/07/2012

## 2012-08-07 NOTE — Progress Notes (Signed)
Occupational Therapy Treatment Patient Details Name: Sydney Scott MRN: [id number] DOB: 21-Dec-1927 Today's Date: 08/07/2012 Time: 7829-5621 OT Time Calculation (min): 9 min  OT Assessment / Plan / Recommendation  History of present illness This is an 79 year old female, with known history of OA, anemia, hospitalized for culture-negative UTI 07/27/12-07/28/12 and subsequently discharged on Ciprofloxacin. According patient she has been compliant with her antibiotics, and has only 2 pills left. Apparently, since then, patient has become progressively weaker, with poor appetite. She denies subjective fever or chills, denies SOB or cough.   Clinical Impression    OT comments  Pt had R hepatic abscess drained 7/24 and has soreness.  Drain in place  Follow Up Recommendations  No OT follow up    Barriers to Discharge       Equipment Recommendations  None recommended by OT    Recommendations for Other Services    Frequency Min 2X/week   Progress towards OT Goals Progress towards OT goals: Progressing toward goals  Plan      Precautions / Restrictions Precautions Precautions: None Restrictions Weight Bearing Restrictions: No   Pertinent Vitals/Pain Soreness R side from procedure yesterday.  Repositioned    ADL  Lower Body Dressing: Set up Where Assessed - Lower Body Dressing: Supported sit to stand ADL Comments: pt had abscess drained yesterday and has a drain.  She lives alone--checked to make sure that pt could don pants. She was able to do this without AE.  Pt wears slip on shoes.  Discussed energy conservation.  Pt plans to look into tub seat herself.  She will sponge bathe intially.  Discussed energy conservation:  rest breaks and prioritizing.       OT Diagnosis:    OT Problem List:   OT Treatment Interventions:     OT Goals(current goals can now be found in the care plan section)    Visit Information  Last OT Received On: 08/07/12 Assistance Needed: +1 History of Present  Illness: This is an 79 year old female, with known history of OA, anemia, hospitalized for culture-negative UTI 07/27/12-07/28/12 and subsequently discharged on Ciprofloxacin. According patient she has been compliant with her antibiotics, and has only 2 pills left. Apparently, since then, patient has become progressively weaker, with poor appetite. She denies subjective fever or chills, denies SOB or cough.    Subjective Data      Prior Functioning       Cognition  Cognition Arousal/Alertness: Awake/alert Behavior During Therapy: WFL for tasks assessed/performed Overall Cognitive Status: Within Functional Limits for tasks assessed    Mobility  Bed Mobility Supine to Sit: 6: Modified independent (Device/Increase time) Sit to Supine: 6: Modified independent (Device/Increase time) Details for Bed Mobility Assistance: increased time    Exercises      Balance     End of Session OT - End of Session Activity Tolerance: Patient tolerated treatment well Patient left: in bed;with call bell/phone within reach  GO     Kehinde Bowdish 08/07/2012, 2:42 PM Marica Otter, OTR/L [phone] 08/07/2012

## 2012-08-07 NOTE — Progress Notes (Signed)
Patient ID: Sydney Scott, female   DOB: 1927-03-31, 79 y.o.   MRN: [id number]    Subjective: No complaints, tolerated drain, denies n/v/pain  Objective: Vital signs in last 24 hours: Temp:  [98.4 F (36.9 C)-102.8 F (39.3 C)] 98.5 F (36.9 C) (07/25 0547) Pulse Rate:  [86-89] 87 (07/25 0547) Resp:  [17-36] 20 (07/25 0547) BP: (123-143)/(68-76) 123/73 mmHg (07/25 0547) SpO2:  [94 %-99 %] 96 % (07/25 0547) Last BM Date: 08/06/12  Intake/Output from previous day: 07/24 0701 - 07/25 0700 In: 620 [P.O.:600] Out: 255 [Drains:80] Intake/Output this shift:    PE: Abd: soft, mildly tender around drain, drain with only serous output this am, +BS General: NAD  Lab Results:   Recent Labs  08/06/12 0455 08/07/12 0505  WBC 17.9* 17.5*  HGB 8.8* 8.9*  HCT 24.3* 24.5*  PLT 320 387   BMET  Recent Labs  08/06/12 0455 08/07/12 0505  NA 136 139  K 3.9 4.5  CL 103 106  CO2 24 25  GLUCOSE 106* 110*  BUN 4* 6  CREATININE 0.60 0.56  CALCIUM 8.2* 8.5   PT/INR No results found for this basename: LABPROT, INR,  in the last 72 hours CMP     Component Value Date/Time   NA 139 08/07/2012 0505   K 4.5 08/07/2012 0505   CL 106 08/07/2012 0505   CO2 25 08/07/2012 0505   GLUCOSE 110* 08/07/2012 0505   BUN 6 08/07/2012 0505   CREATININE 0.56 08/07/2012 0505   CALCIUM 8.5 08/07/2012 0505   PROT 5.9* 08/07/2012 0505   ALBUMIN 1.7* 08/07/2012 0505   AST 23 08/07/2012 0505   ALT 19 08/07/2012 0505   ALKPHOS 164* 08/07/2012 0505   BILITOT 1.6* 08/07/2012 0505   GFRNONAA 84* 08/07/2012 0505   GFRAA >90 08/07/2012 0505   Lipase  No results found for this basename: lipase       Studies/Results: Ct Chest W Contrast  08/05/2012   *RADIOLOGY REPORT*  Clinical Data:  Fever of unknown origin.  Elevated Nykia Turko cell count.  Pneumonia.  Right lower lobe infiltrates.  CT CHEST, ABDOMEN AND PELVIS WITH CONTRAST  Technique:  Multidetector CT imaging of the chest, abdomen and pelvis was performed  following the standard protocol during bolus administration of intravenous contrast.  Contrast: OMNIPAQUE IOHEXOL 300 MG/ML  SOLN, 50mL OMNIPAQUE IOHEXOL 300 MG/ML  SOLN  Comparison:   None.  CT CHEST  Findings:  Normal heart size.  Coronary artery calcifications. Normal caliber thoracic aorta with calcification.  Great vessels are unremarkable.  No evidence of aortic dissection.  No significant lymphadenopathy in the chest.  The esophagus is decompressed.  Small bilateral pleural effusions with atelectasis or consolidation in the lung bases.  Visualization of lung fields is limited due to respiratory motion artifact but no apparent pneumothorax or interstitial changes are identified.  Airways appear patent.  Emphysematous changes and scarring in the apices. Degenerative changes in the thoracic spine.  No destructive bone lesions appreciated.  IMPRESSION: Small bilateral pleural effusions with atelectasis or consolidation in the lung bases.  CT ABDOMEN AND PELVIS  Findings:  There is a large circumscribed loculated hypodense structure involving most of the right lobe of the liver and measuring about 9.9 x 5.9 by 8.7 cm.  There is mild peripheral enhancement.  Given the history of fevers, this may represent hepatic abscess.  Necrotic mass or metastasis is not excluded.  No bile duct dilatation.  The gallbladder is contracted with mildly thickened wall.  Gallstones are present.  No pericholecystic infiltration.  The pancreas, spleen, adrenal glands, inferior vena cava, and retroperitoneal lymph nodes are unremarkable.  Cysts in the lower pole of the right kidney, largest measuring 3.2 cm diameter.  No evidence of solid mass or hydronephrosis in either kidney.  There is an infrarenal abdominal aortic aneurysm extending to the bifurcation with involvement of the right iliac artery.  The aneurysm measures up to about 4 cm maximal AP diameter by 4.1 cm transverse diameter.  Right iliac artery measures 1.9 cm  diameter. There is extensive aortoiliac calcification with mild mural thrombus. No abnormal retroperitoneal fluid collection to suggest rupture.  The stomach is decompressed.  Small bowel are decompressed.  Stool filled colon without distension.  No free air or free fluid in the abdomen.  Abdominal wall musculature appears intact although atrophic.  Pelvis:  Small amount of free fluid in the pelvis.  No loculated fluid collections are identified.  Calcification in the uterus suggesting fibroids.  No abnormal adnexal masses.  The bladder demonstrates diffusely thick walled with diverticula arising from the right lateral wall and the anterior wall of the bladder. Changes suggest cystitis versus hypertrophy due to outlet obstruction.  The appendix is normal.  There is diverticulosis of the sigmoid colon.  No inflammatory changes to suggest diverticulitis.  There are giant appearing stool filled diverticula present.  These measure up to about 3.2 cm diameter.  No significant pelvic lymphadenopathy.  Focal subcutaneous gas collection in the anterior left abdominal wall likely representing injection site.  Degenerative changes in the lumbar spine with normal alignment.  No destructive bone lesions are appreciated.  IMPRESSION: Lobulated hypodense lesion in the right lobe of the liver likely represents a hepatic abscess versus necrotic tumor.  No evidence of appendicitis or diverticulitis.  The gallbladder is contracted with stones.  Cholecystitis is not excluded.  Extensive diverticulosis of the sigmoid colon.  Abdominal aortic aneurysm measuring up to 4.1 cm maximal AP diameter. Bladder wall thickening with bladder diverticula.   Original Report Authenticated By: Burman Nieves, M.D.   Ct Abdomen Pelvis W Contrast  08/05/2012   *RADIOLOGY REPORT*  Clinical Data:  Fever of unknown origin.  Elevated Tsuyako Jolley cell count.  Pneumonia.  Right lower lobe infiltrates.  CT CHEST, ABDOMEN AND PELVIS WITH CONTRAST  Technique:   Multidetector CT imaging of the chest, abdomen and pelvis was performed following the standard protocol during bolus administration of intravenous contrast.  Contrast: OMNIPAQUE IOHEXOL 300 MG/ML  SOLN, 50mL OMNIPAQUE IOHEXOL 300 MG/ML  SOLN  Comparison:   None.  CT CHEST  Findings:  Normal heart size.  Coronary artery calcifications. Normal caliber thoracic aorta with calcification.  Great vessels are unremarkable.  No evidence of aortic dissection.  No significant lymphadenopathy in the chest.  The esophagus is decompressed.  Small bilateral pleural effusions with atelectasis or consolidation in the lung bases.  Visualization of lung fields is limited due to respiratory motion artifact but no apparent pneumothorax or interstitial changes are identified.  Airways appear patent.  Emphysematous changes and scarring in the apices. Degenerative changes in the thoracic spine.  No destructive bone lesions appreciated.  IMPRESSION: Small bilateral pleural effusions with atelectasis or consolidation in the lung bases.  CT ABDOMEN AND PELVIS  Findings:  There is a large circumscribed loculated hypodense structure involving most of the right lobe of the liver and measuring about 9.9 x 5.9 by 8.7 cm.  There is mild peripheral enhancement.  Given the history of fevers,  this may represent hepatic abscess.  Necrotic mass or metastasis is not excluded.  No bile duct dilatation.  The gallbladder is contracted with mildly thickened wall.  Gallstones are present.  No pericholecystic infiltration.  The pancreas, spleen, adrenal glands, inferior vena cava, and retroperitoneal lymph nodes are unremarkable.  Cysts in the lower pole of the right kidney, largest measuring 3.2 cm diameter.  No evidence of solid mass or hydronephrosis in either kidney.  There is an infrarenal abdominal aortic aneurysm extending to the bifurcation with involvement of the right iliac artery.  The aneurysm measures up to about 4 cm maximal AP diameter by  4.1 cm transverse diameter.  Right iliac artery measures 1.9 cm diameter. There is extensive aortoiliac calcification with mild mural thrombus. No abnormal retroperitoneal fluid collection to suggest rupture.  The stomach is decompressed.  Small bowel are decompressed.  Stool filled colon without distension.  No free air or free fluid in the abdomen.  Abdominal wall musculature appears intact although atrophic.  Pelvis:  Small amount of free fluid in the pelvis.  No loculated fluid collections are identified.  Calcification in the uterus suggesting fibroids.  No abnormal adnexal masses.  The bladder demonstrates diffusely thick walled with diverticula arising from the right lateral wall and the anterior wall of the bladder. Changes suggest cystitis versus hypertrophy due to outlet obstruction.  The appendix is normal.  There is diverticulosis of the sigmoid colon.  No inflammatory changes to suggest diverticulitis.  There are giant appearing stool filled diverticula present.  These measure up to about 3.2 cm diameter.  No significant pelvic lymphadenopathy.  Focal subcutaneous gas collection in the anterior left abdominal wall likely representing injection site.  Degenerative changes in the lumbar spine with normal alignment.  No destructive bone lesions are appreciated.  IMPRESSION: Lobulated hypodense lesion in the right lobe of the liver likely represents a hepatic abscess versus necrotic tumor.  No evidence of appendicitis or diverticulitis.  The gallbladder is contracted with stones.  Cholecystitis is not excluded.  Extensive diverticulosis of the sigmoid colon.  Abdominal aortic aneurysm measuring up to 4.1 cm maximal AP diameter. Bladder wall thickening with bladder diverticula.   Original Report Authenticated By: Burman Nieves, M.D.   Ct Image Guided Drainage By Percutaneous Catheter  08/06/2012   *RADIOLOGY REPORT*  Clinical Data: Hepatic abscess with fever and leukocytosis.  CT GUIDED DRAINAGE OF  HEPATIC ABSCESS  Sedation:  1.5 mg IV Versed;  100 mcg IV Fentanyl  Total Moderate Sedation Time: 14 minutes.  Procedure:  The procedure, risks, benefits, and alternatives were explained to the patient.  Questions regarding the procedure were encouraged and answered. The patient understands and consents to the procedure.  The right abdominal wall was prepped with Betadine in a sterile fashion, and a sterile drape was applied covering the operative field.  A sterile gown and sterile gloves were used for the procedure. Local anesthesia was provided with 1% Lidocaine.  The CT was performed in a supine and slightly oblique position with the right side rolled up.  After localizing a large hepatic abscess, an 18 gauge needle was advanced into the collection. Aspiration of fluid sample was performed with the sample sent for culture analysis.  A guidewire was advanced into the collection.  The tract was dilated and a 12-French drainage catheter placed in the collection.  The catheter was connected to a suction bulb.  It was secured at the skin with a Prolene retention suture and Stat- Lock device.  Complications: None  Findings: Aspiration revealed grossly purulent fluid.  The percutaneous drain was positioned in the dominant liquefied pocket posteriorly.  There is excellent drainage of fluid from the drain after placement.  IMPRESSION: CT guided drainage of hepatic abscess.  A grossly purulent fluid sample was sent for culture analysis.  A 12-French drain was placed in the abscess.  This was connected to a suction bulb.  Output will be followed.   Original Report Authenticated By: Irish Lack, M.D.    Anti-infectives: Anti-infectives   Start     Dose/Rate Route Frequency Ordered Stop   08/04/12 0000  vancomycin (VANCOCIN) IVPB 1000 mg/200 mL premix  Status:  Discontinued     1,000 mg 200 mL/hr over 60 Minutes Intravenous Every 12 hours 08/04/12 1920 08/04/12 2037   08/03/12 2200  metroNIDAZOLE (FLAGYL) tablet  1,000 mg     1,000 mg Oral  Once 08/03/12 1159 08/03/12 2150   08/03/12 1000  metroNIDAZOLE (FLAGYL) tablet 1,000 mg     1,000 mg Oral  Once 08/03/12 0901 08/03/12 0942   08/03/12 0200  piperacillin-tazobactam (ZOSYN) IVPB 3.375 g     3.375 g 12.5 mL/hr over 240 Minutes Intravenous Every 8 hours 08/02/12 1710     08/02/12 1800  vancomycin (VANCOCIN) 500 mg in sodium chloride 0.9 % 100 mL IVPB     500 mg 100 mL/hr over 60 Minutes Intravenous Every 12 hours 08/02/12 1710 08/04/12 1931   08/02/12 1700  piperacillin-tazobactam (ZOSYN) IVPB 3.375 g     3.375 g 100 mL/hr over 30 Minutes Intravenous  Once 08/02/12 1656 08/02/12 1859       Assessment/Plan Large Liver Abscess: s/p perc drain 7/24, cultures sent, purulent output at drain placement but serous looking today, cont IV abx coverage until c and S back,  No indication for acute surgical intervention at this time, will follow.  LOS: 5 days    Zoella Roberti 08/07/2012

## 2012-08-07 NOTE — Progress Notes (Signed)
[facility] PROGRESS NOTE  Sydney Scott ZOX:[id number] DOB: 09-Oct-1927 DOA: 08/02/2012 PCP: No PCP Per Patient  Brief narrative: This is an 79 year old female, with known history of OA, anemia, hospitalized for culture-negative UTI 07/27/12-07/28/12 and subsequently discharged on Ciprofloxacin. According patient she has been compliant with her antibiotics, and has only 2 pills left. Apparently, since then, patient has become progressively weaker, with poor appetite. She denies subjective fever or chills, denies SOB or cough. Review of EMR reveals that blood cultures of 07/27/12. Grew microaerophilic streptococci in 1:3 bottles. CXR in the ED revealed a right basilar opacity. Wcc was 19.3, temp 99.2.   Assessment/Plan: Active Problems:   HAP (hospital-acquired pneumonia)   Bacteremia   Liver abscess  1. HAP (hospital-acquired pneumonia): Patient presented with progressive weakness, low grade pyrexia and FTT, about 5 days post-hospitalization for UTI, despite compliance with Ciprofloxacin therapy. Wcc was elevated at 19.3, with neutrophilia. CXR confirmed RLL pneumonia. This clinical scenario is consistent with HAP. Managed with iv Vancomycin/Zosyn, as well as supportive treatment. Patient does not look toxic, however, has continued intermittent fever which may be secondary to abdominal process too. Patient feels clinically much better. Repeat CXR of 08/04/12, showed stable findings. Chest CT scan of 08/05/12, showed small bilateral pleural effusions with atelectasis or consolidation in the lung bases. Now day #6 antibiotics. Patient currently has no respiratory tract symptoms.  2. Bacteremia (07/27/12)/Hepatic abscess (R/O Tumor): Review of EMR revealed that blood cultures of 07/27/12, grew microarophilic streptococcus in 1:3 bottles. It is not clear what role this may have had in current presentation. Above antibiotic choice should adequately cover this pathogen. Repeat blood cultures are negative so  far. In view of recrudescence of fever, 2D Echocardiogram was done on 08/03/12, and this showed normal LV cavity size, mild concentric hypertrophy, EF of 55% to 60% and no regional wall motion abnormalities. The atrium was mildly dilated and valve were sclerotic. Dr Judyann Munson provided ID consultation. Managing as recommended. She discontinued Vancomycin on 08/04/12. Patient was subsequently seen by Dr Tinnie Gens hatcher, and he recommended Chest and Abdominal/Pelvic CT scan. Chest CT scan findings are described in #1 above. Abdominal/Pelvic CT on the other hand, revealed a large circumscribed loculated hypodense structure involving most of the right lobe of the liver and measuring about 9.9 x 5.9 by 8.7 cm. There is mild peripheral enhancement. General surgery and interventional radiology consulted. Patient is status post aspiration of hepatic abscess on 08/06/12 - has RUQ drain. No organisms on gram stain. 3. Hypokalemia: Potassium was 2.7 on admission. Patient is on no culprit medication, so etiology is unclear. No doubt contributory to generalized weakness. Magnesium level is normal. TSH is 1.214. Repleted as indicated. Resolved.  4. Anemia: This is mild, microcytic. Anemia panel revealed iron deficiency. FOBT is pending. Patient will benefit from outpatient colonoscopy. Stable. 5. Recent UTI: This was diagnosed on 07/27/12, and was culture-negative. Patient has completed a total of 7 days antibiotics, for this. Repeat U/A has revealed persistent pyuria and bacteriuria. Adequately addressed with current antibiotic choice. Urine culture is negative so far.  6. Trichomoniasis: This was an incidental finding on U/A. Addressed with 2g Flagyl PO on 08/03/12. HIV test and RPR are negative.  7. Leukocytosis: secondary to # 1&2. Monitor. 8. Cholelithiasis and extensive sigmoid diverticulosis: seen on CT abdomen. 9. Abdominal Aortic Aneurysm: seen on CT. 4.1 cm AP diameter. Outpatient followup.   Code Status: Full  Code.  Family Communication: None Disposition Plan: To be determined.  Consultants:  ID  Surgery  IR   Procedures:  CXR.   2D Echocardiogram.   CT-guided drainage of hepatic abscess and placement of 12 French drain on 08/06/12  Antibiotics:  Vancomycin 08/02/12-08/04/12  Zosyn 08/02/12>>>  HPI/Subjective: Overall feels better. Mild soreness in right upper abdomen which is significantly improved compared to yesterday. Denies nausea or vomiting.   Objective: Vital signs in last 24 hours: Temp:  [98.4 F (36.9 C)-102.8 F (39.3 C)] 99 F (37.2 C) (07/25 1418) Pulse Rate:  [83-89] 83 (07/25 1418) Resp:  [18-36] 18 (07/25 1418) BP: (123-166)/(68-85) 166/85 mmHg (07/25 1418) SpO2:  [95 %-99 %] 95 % (07/25 1418) Weight change:  Last BM Date: 08/06/12  Intake/Output from previous day: 07/24 0701 - 07/25 0700 In: 620 [P.O.:600] Out: 255 [Drains:80]     Physical Exam: General: Comfortable.  CHEST: Clinically clear to auscultation, no wheezes, no crackles. No increased work of breathing. HEART: Sounds 1 and 2 heard, normal, regular, no murmurs. No JVD or pedal edema. ABDOMEN: Full, soft, no palpable organomegaly, no palpable masses, normal bowel sounds. RUQ drain-minimal purulent fluid in bulb. Mild RUQ tenderness but without rigidity, guarding or rebound. LOWER EXTREMITIES: No pitting edema, palpable peripheral pulses.  CENTRAL NERVOUS SYSTEM: No focal neurologic deficit on gross examination. Alert and oriented.  Lab Results:  Recent Labs  08/06/12 0455 08/07/12 0505  WBC 17.9* 17.5*  HGB 8.8* 8.9*  HCT 24.3* 24.5*  PLT 320 387    Recent Labs  08/06/12 0455 08/07/12 0505  NA 136 139  K 3.9 4.5  CL 103 106  CO2 24 25  GLUCOSE 106* 110*  BUN 4* 6  CREATININE 0.60 0.56  CALCIUM 8.2* 8.5   Recent Results (from the past 240 hour(s))  CULTURE, BLOOD (ROUTINE X 2)     Status: None   Collection Time    08/02/12  4:40 PM      Result Value Range Status    Specimen Description BLOOD RIGHT HAND   Final   Special Requests NONE   Final   Culture  Setup Time 08/02/2012 20:54   Final   Culture     Final   Value:        BLOOD CULTURE RECEIVED NO GROWTH TO DATE CULTURE WILL BE HELD FOR 5 DAYS BEFORE ISSUING A FINAL NEGATIVE REPORT   Report Status PENDING   Incomplete  CULTURE, BLOOD (ROUTINE X 2)     Status: None   Collection Time    08/02/12  5:34 PM      Result Value Range Status   Specimen Description BLOOD LEFT ANTECUBITAL   Final   Special Requests BOTTLES DRAWN AEROBIC AND ANAEROBIC 4CC   Final   Culture  Setup Time 08/02/2012 20:47   Final   Culture     Final   Value:        BLOOD CULTURE RECEIVED NO GROWTH TO DATE CULTURE WILL BE HELD FOR 5 DAYS BEFORE ISSUING A FINAL NEGATIVE REPORT   Report Status PENDING   Incomplete  URINE CULTURE     Status: None   Collection Time    08/02/12  6:41 PM      Result Value Range Status   Specimen Description URINE, CLEAN CATCH   Final   Special Requests NONE   Final   Culture  Setup Time 08/03/2012 03:38   Final   Colony Count NO GROWTH   Final   Culture NO GROWTH   Final   Report Status  08/04/2012 FINAL   Final  CULTURE, ROUTINE-ABSCESS     Status: None   Collection Time    08/06/12  3:31 PM      Result Value Range Status   Specimen Description LIVER ABSCESS   Final   Special Requests Normal   Final   Gram Stain     Final   Value: NO WBC SEEN     NO SQUAMOUS EPITHELIAL CELLS SEEN     NO ORGANISMS SEEN   Culture PENDING   Incomplete   Report Status PENDING   Incomplete  ANAEROBIC CULTURE     Status: None   Collection Time    08/06/12  3:31 PM      Result Value Range Status   Specimen Description LIVER ABSCESS   Final   Special Requests Normal   Final   Gram Stain     Final   Value: NO WBC SEEN     NO SQUAMOUS EPITHELIAL CELLS SEEN     NO ORGANISMS SEEN   Culture     Final   Value: NO ANAEROBES ISOLATED; CULTURE IN PROGRESS FOR 5 DAYS   Report Status PENDING   Incomplete      Studies/Results: Ct Chest W Contrast  08/05/2012   *RADIOLOGY REPORT*  Clinical Data:  Fever of unknown origin.  Elevated white cell count.  Pneumonia.  Right lower lobe infiltrates.  CT CHEST, ABDOMEN AND PELVIS WITH CONTRAST  Technique:  Multidetector CT imaging of the chest, abdomen and pelvis was performed following the standard protocol during bolus administration of intravenous contrast.  Contrast: OMNIPAQUE IOHEXOL 300 MG/ML  SOLN, 50mL OMNIPAQUE IOHEXOL 300 MG/ML  SOLN  Comparison:   None.  CT CHEST  Findings:  Normal heart size.  Coronary artery calcifications. Normal caliber thoracic aorta with calcification.  Great vessels are unremarkable.  No evidence of aortic dissection.  No significant lymphadenopathy in the chest.  The esophagus is decompressed.  Small bilateral pleural effusions with atelectasis or consolidation in the lung bases.  Visualization of lung fields is limited due to respiratory motion artifact but no apparent pneumothorax or interstitial changes are identified.  Airways appear patent.  Emphysematous changes and scarring in the apices. Degenerative changes in the thoracic spine.  No destructive bone lesions appreciated.  IMPRESSION: Small bilateral pleural effusions with atelectasis or consolidation in the lung bases.  CT ABDOMEN AND PELVIS  Findings:  There is a large circumscribed loculated hypodense structure involving most of the right lobe of the liver and measuring about 9.9 x 5.9 by 8.7 cm.  There is mild peripheral enhancement.  Given the history of fevers, this may represent hepatic abscess.  Necrotic mass or metastasis is not excluded.  No bile duct dilatation.  The gallbladder is contracted with mildly thickened wall.  Gallstones are present.  No pericholecystic infiltration.  The pancreas, spleen, adrenal glands, inferior vena cava, and retroperitoneal lymph nodes are unremarkable.  Cysts in the lower pole of the right kidney, largest measuring 3.2 cm diameter.   No evidence of solid mass or hydronephrosis in either kidney.  There is an infrarenal abdominal aortic aneurysm extending to the bifurcation with involvement of the right iliac artery.  The aneurysm measures up to about 4 cm maximal AP diameter by 4.1 cm transverse diameter.  Right iliac artery measures 1.9 cm diameter. There is extensive aortoiliac calcification with mild mural thrombus. No abnormal retroperitoneal fluid collection to suggest rupture.  The stomach is decompressed.  Small bowel are decompressed.  Stool filled colon without distension.  No free air or free fluid in the abdomen.  Abdominal wall musculature appears intact although atrophic.  Pelvis:  Small amount of free fluid in the pelvis.  No loculated fluid collections are identified.  Calcification in the uterus suggesting fibroids.  No abnormal adnexal masses.  The bladder demonstrates diffusely thick walled with diverticula arising from the right lateral wall and the anterior wall of the bladder. Changes suggest cystitis versus hypertrophy due to outlet obstruction.  The appendix is normal.  There is diverticulosis of the sigmoid colon.  No inflammatory changes to suggest diverticulitis.  There are giant appearing stool filled diverticula present.  These measure up to about 3.2 cm diameter.  No significant pelvic lymphadenopathy.  Focal subcutaneous gas collection in the anterior left abdominal wall likely representing injection site.  Degenerative changes in the lumbar spine with normal alignment.  No destructive bone lesions are appreciated.  IMPRESSION: Lobulated hypodense lesion in the right lobe of the liver likely represents a hepatic abscess versus necrotic tumor.  No evidence of appendicitis or diverticulitis.  The gallbladder is contracted with stones.  Cholecystitis is not excluded.  Extensive diverticulosis of the sigmoid colon.  Abdominal aortic aneurysm measuring up to 4.1 cm maximal AP diameter. Bladder wall thickening with bladder  diverticula.   Original Report Authenticated By: Burman Nieves, M.D.   Ct Abdomen Pelvis W Contrast  08/05/2012   *RADIOLOGY REPORT*  Clinical Data:  Fever of unknown origin.  Elevated white cell count.  Pneumonia.  Right lower lobe infiltrates.  CT CHEST, ABDOMEN AND PELVIS WITH CONTRAST  Technique:  Multidetector CT imaging of the chest, abdomen and pelvis was performed following the standard protocol during bolus administration of intravenous contrast.  Contrast: OMNIPAQUE IOHEXOL 300 MG/ML  SOLN, 50mL OMNIPAQUE IOHEXOL 300 MG/ML  SOLN  Comparison:   None.  CT CHEST  Findings:  Normal heart size.  Coronary artery calcifications. Normal caliber thoracic aorta with calcification.  Great vessels are unremarkable.  No evidence of aortic dissection.  No significant lymphadenopathy in the chest.  The esophagus is decompressed.  Small bilateral pleural effusions with atelectasis or consolidation in the lung bases.  Visualization of lung fields is limited due to respiratory motion artifact but no apparent pneumothorax or interstitial changes are identified.  Airways appear patent.  Emphysematous changes and scarring in the apices. Degenerative changes in the thoracic spine.  No destructive bone lesions appreciated.  IMPRESSION: Small bilateral pleural effusions with atelectasis or consolidation in the lung bases.  CT ABDOMEN AND PELVIS  Findings:  There is a large circumscribed loculated hypodense structure involving most of the right lobe of the liver and measuring about 9.9 x 5.9 by 8.7 cm.  There is mild peripheral enhancement.  Given the history of fevers, this may represent hepatic abscess.  Necrotic mass or metastasis is not excluded.  No bile duct dilatation.  The gallbladder is contracted with mildly thickened wall.  Gallstones are present.  No pericholecystic infiltration.  The pancreas, spleen, adrenal glands, inferior vena cava, and retroperitoneal lymph nodes are unremarkable.  Cysts in the lower  pole of the right kidney, largest measuring 3.2 cm diameter.  No evidence of solid mass or hydronephrosis in either kidney.  There is an infrarenal abdominal aortic aneurysm extending to the bifurcation with involvement of the right iliac artery.  The aneurysm measures up to about 4 cm maximal AP diameter by 4.1 cm transverse diameter.  Right iliac artery measures 1.9 cm diameter.  There is extensive aortoiliac calcification with mild mural thrombus. No abnormal retroperitoneal fluid collection to suggest rupture.  The stomach is decompressed.  Small bowel are decompressed.  Stool filled colon without distension.  No free air or free fluid in the abdomen.  Abdominal wall musculature appears intact although atrophic.  Pelvis:  Small amount of free fluid in the pelvis.  No loculated fluid collections are identified.  Calcification in the uterus suggesting fibroids.  No abnormal adnexal masses.  The bladder demonstrates diffusely thick walled with diverticula arising from the right lateral wall and the anterior wall of the bladder. Changes suggest cystitis versus hypertrophy due to outlet obstruction.  The appendix is normal.  There is diverticulosis of the sigmoid colon.  No inflammatory changes to suggest diverticulitis.  There are giant appearing stool filled diverticula present.  These measure up to about 3.2 cm diameter.  No significant pelvic lymphadenopathy.  Focal subcutaneous gas collection in the anterior left abdominal wall likely representing injection site.  Degenerative changes in the lumbar spine with normal alignment.  No destructive bone lesions are appreciated.  IMPRESSION: Lobulated hypodense lesion in the right lobe of the liver likely represents a hepatic abscess versus necrotic tumor.  No evidence of appendicitis or diverticulitis.  The gallbladder is contracted with stones.  Cholecystitis is not excluded.  Extensive diverticulosis of the sigmoid colon.  Abdominal aortic aneurysm measuring up to  4.1 cm maximal AP diameter. Bladder wall thickening with bladder diverticula.   Original Report Authenticated By: Burman Nieves, M.D.   Ct Image Guided Drainage By Percutaneous Catheter  08/06/2012   *RADIOLOGY REPORT*  Clinical Data: Hepatic abscess with fever and leukocytosis.  CT GUIDED DRAINAGE OF HEPATIC ABSCESS  Sedation:  1.5 mg IV Versed;  100 mcg IV Fentanyl  Total Moderate Sedation Time: 14 minutes.  Procedure:  The procedure, risks, benefits, and alternatives were explained to the patient.  Questions regarding the procedure were encouraged and answered. The patient understands and consents to the procedure.  The right abdominal wall was prepped with Betadine in a sterile fashion, and a sterile drape was applied covering the operative field.  A sterile gown and sterile gloves were used for the procedure. Local anesthesia was provided with 1% Lidocaine.  The CT was performed in a supine and slightly oblique position with the right side rolled up.  After localizing a large hepatic abscess, an 18 gauge needle was advanced into the collection. Aspiration of fluid sample was performed with the sample sent for culture analysis.  A guidewire was advanced into the collection.  The tract was dilated and a 12-French drainage catheter placed in the collection.  The catheter was connected to a suction bulb.  It was secured at the skin with a Prolene retention suture and Stat- Lock device.  Complications: None  Findings: Aspiration revealed grossly purulent fluid.  The percutaneous drain was positioned in the dominant liquefied pocket posteriorly.  There is excellent drainage of fluid from the drain after placement.  IMPRESSION: CT guided drainage of hepatic abscess.  A grossly purulent fluid sample was sent for culture analysis.  A 12-French drain was placed in the abscess.  This was connected to a suction bulb.  Output will be followed.   Original Report Authenticated By: Irish Lack, M.D.     Medications: Scheduled Meds: . heparin  5,000 Units Subcutaneous Q8H  . piperacillin-tazobactam (ZOSYN)  IV  3.375 g Intravenous Q8H  . potassium chloride  40 mEq Oral BID  . sodium chloride  3 mL Intravenous Q12H   Continuous Infusions:   PRN Meds:.sodium chloride, acetaminophen, acetaminophen, albuterol, morphine injection, ondansetron (ZOFRAN) IV, ondansetron, oxyCODONE, sodium chloride    LOS: 5 days   [facility]  [facility] Pager [phone]. If 8PM-8AM, please contact night-coverage at [email or web address], password [facility] 08/07/2012, 3:00 PM  LOS: 5 days

## 2012-08-07 NOTE — Progress Notes (Signed)
INFECTIOUS DISEASE PROGRESS NOTE  ID: Sydney Scott is a 79 y.o. female with  Active Problems:   HAP (hospital-acquired pneumonia)   Bacteremia   Liver abscess  Subjective: Without complaints  Abtx:  Anti-infectives   Start     Dose/Rate Route Frequency Ordered Stop   08/04/12 0000  vancomycin (VANCOCIN) IVPB 1000 mg/200 mL premix  Status:  Discontinued     1,000 mg 200 mL/hr over 60 Minutes Intravenous Every 12 hours 08/04/12 1920 08/04/12 2037   08/03/12 2200  metroNIDAZOLE (FLAGYL) tablet 1,000 mg     1,000 mg Oral  Once 08/03/12 1159 08/03/12 2150   08/03/12 1000  metroNIDAZOLE (FLAGYL) tablet 1,000 mg     1,000 mg Oral  Once 08/03/12 0901 08/03/12 0942   08/03/12 0200  piperacillin-tazobactam (ZOSYN) IVPB 3.375 g     3.375 g 12.5 mL/hr over 240 Minutes Intravenous Every 8 hours 08/02/12 1710     08/02/12 1800  vancomycin (VANCOCIN) 500 mg in sodium chloride 0.9 % 100 mL IVPB     500 mg 100 mL/hr over 60 Minutes Intravenous Every 12 hours 08/02/12 1710 08/04/12 1931   08/02/12 1700  piperacillin-tazobactam (ZOSYN) IVPB 3.375 g     3.375 g 100 mL/hr over 30 Minutes Intravenous  Once 08/02/12 1656 08/02/12 1859      Medications:  Scheduled: . heparin  5,000 Units Subcutaneous Q8H  . piperacillin-tazobactam (ZOSYN)  IV  3.375 g Intravenous Q8H  . potassium chloride  40 mEq Oral BID  . sodium chloride  3 mL Intravenous Q12H    Objective: Vital signs in last 24 hours: Temp:  [98.4 F (36.9 C)-102.8 F (39.3 C)] 98.5 F (36.9 C) (07/25 0547) Pulse Rate:  [86-89] 87 (07/25 0547) Resp:  [17-36] 20 (07/25 0547) BP: (123-143)/(68-76) 123/73 mmHg (07/25 0547) SpO2:  [94 %-99 %] 96 % (07/25 0547)   General appearance: alert, cooperative and no distress Resp: clear to auscultation bilaterally Cardio: regular rate and rhythm GI: normal findings: bowel sounds normal and soft, non-tender and abnormal findings:  distended  Lab Results  Recent Labs  08/06/12 0455  08/07/12 0505  WBC 17.9* 17.5*  HGB 8.8* 8.9*  HCT 24.3* 24.5*  NA 136 139  K 3.9 4.5  CL 103 106  CO2 24 25  BUN 4* 6  CREATININE 0.60 0.56   Liver Panel  Recent Labs  08/07/12 0505  PROT 5.9*  ALBUMIN 1.7*  AST 23  ALT 19  ALKPHOS 164*  BILITOT 1.6*   Sedimentation Rate No results found for this basename: ESRSEDRATE,  in the last 72 hours C-Reactive Protein No results found for this basename: CRP,  in the last 72 hours  Microbiology: Recent Results (from the past 240 hour(s))  CULTURE, BLOOD (ROUTINE X 2)     Status: None   Collection Time    08/02/12  4:40 PM      Result Value Range Status   Specimen Description BLOOD RIGHT HAND   Final   Special Requests NONE   Final   Culture  Setup Time 08/02/2012 20:54   Final   Culture     Final   Value:        BLOOD CULTURE RECEIVED NO GROWTH TO DATE CULTURE WILL BE HELD FOR 5 DAYS BEFORE ISSUING A FINAL NEGATIVE REPORT   Report Status PENDING   Incomplete  CULTURE, BLOOD (ROUTINE X 2)     Status: None   Collection Time    08/02/12  5:34  PM      Result Value Range Status   Specimen Description BLOOD LEFT ANTECUBITAL   Final   Special Requests BOTTLES DRAWN AEROBIC AND ANAEROBIC 4CC   Final   Culture  Setup Time 08/02/2012 20:47   Final   Culture     Final   Value:        BLOOD CULTURE RECEIVED NO GROWTH TO DATE CULTURE WILL BE HELD FOR 5 DAYS BEFORE ISSUING A FINAL NEGATIVE REPORT   Report Status PENDING   Incomplete  URINE CULTURE     Status: None   Collection Time    08/02/12  6:41 PM      Result Value Range Status   Specimen Description URINE, CLEAN CATCH   Final   Special Requests NONE   Final   Culture  Setup Time 08/03/2012 03:38   Final   Colony Count NO GROWTH   Final   Culture NO GROWTH   Final   Report Status 08/04/2012 FINAL   Final  CULTURE, ROUTINE-ABSCESS     Status: None   Collection Time    08/06/12  3:31 PM      Result Value Range Status   Specimen Description LIVER ABSCESS   Final   Special  Requests Normal   Final   Gram Stain     Final   Value: NO WBC SEEN     NO SQUAMOUS EPITHELIAL CELLS SEEN     NO ORGANISMS SEEN   Culture PENDING   Incomplete   Report Status PENDING   Incomplete  ANAEROBIC CULTURE     Status: None   Collection Time    08/06/12  3:31 PM      Result Value Range Status   Specimen Description LIVER ABSCESS   Final   Special Requests Normal   Final   Gram Stain     Final   Value: NO WBC SEEN     NO SQUAMOUS EPITHELIAL CELLS SEEN     NO ORGANISMS SEEN   Culture PENDING   Incomplete   Report Status PENDING   Incomplete    Studies/Results: Ct Chest W Contrast  08/05/2012   *RADIOLOGY REPORT*  Clinical Data:  Fever of unknown origin.  Elevated white cell count.  Pneumonia.  Right lower lobe infiltrates.  CT CHEST, ABDOMEN AND PELVIS WITH CONTRAST  Technique:  Multidetector CT imaging of the chest, abdomen and pelvis was performed following the standard protocol during bolus administration of intravenous contrast.  Contrast: OMNIPAQUE IOHEXOL 300 MG/ML  SOLN, 50mL OMNIPAQUE IOHEXOL 300 MG/ML  SOLN  Comparison:   None.  CT CHEST  Findings:  Normal heart size.  Coronary artery calcifications. Normal caliber thoracic aorta with calcification.  Great vessels are unremarkable.  No evidence of aortic dissection.  No significant lymphadenopathy in the chest.  The esophagus is decompressed.  Small bilateral pleural effusions with atelectasis or consolidation in the lung bases.  Visualization of lung fields is limited due to respiratory motion artifact but no apparent pneumothorax or interstitial changes are identified.  Airways appear patent.  Emphysematous changes and scarring in the apices. Degenerative changes in the thoracic spine.  No destructive bone lesions appreciated.  IMPRESSION: Small bilateral pleural effusions with atelectasis or consolidation in the lung bases.  CT ABDOMEN AND PELVIS  Findings:  There is a large circumscribed loculated hypodense structure  involving most of the right lobe of the liver and measuring about 9.9 x 5.9 by 8.7 cm.  There is mild peripheral  enhancement.  Given the history of fevers, this may represent hepatic abscess.  Necrotic mass or metastasis is not excluded.  No bile duct dilatation.  The gallbladder is contracted with mildly thickened wall.  Gallstones are present.  No pericholecystic infiltration.  The pancreas, spleen, adrenal glands, inferior vena cava, and retroperitoneal lymph nodes are unremarkable.  Cysts in the lower pole of the right kidney, largest measuring 3.2 cm diameter.  No evidence of solid mass or hydronephrosis in either kidney.  There is an infrarenal abdominal aortic aneurysm extending to the bifurcation with involvement of the right iliac artery.  The aneurysm measures up to about 4 cm maximal AP diameter by 4.1 cm transverse diameter.  Right iliac artery measures 1.9 cm diameter. There is extensive aortoiliac calcification with mild mural thrombus. No abnormal retroperitoneal fluid collection to suggest rupture.  The stomach is decompressed.  Small bowel are decompressed.  Stool filled colon without distension.  No free air or free fluid in the abdomen.  Abdominal wall musculature appears intact although atrophic.  Pelvis:  Small amount of free fluid in the pelvis.  No loculated fluid collections are identified.  Calcification in the uterus suggesting fibroids.  No abnormal adnexal masses.  The bladder demonstrates diffusely thick walled with diverticula arising from the right lateral wall and the anterior wall of the bladder. Changes suggest cystitis versus hypertrophy due to outlet obstruction.  The appendix is normal.  There is diverticulosis of the sigmoid colon.  No inflammatory changes to suggest diverticulitis.  There are giant appearing stool filled diverticula present.  These measure up to about 3.2 cm diameter.  No significant pelvic lymphadenopathy.  Focal subcutaneous gas collection in the anterior  left abdominal wall likely representing injection site.  Degenerative changes in the lumbar spine with normal alignment.  No destructive bone lesions are appreciated.  IMPRESSION: Lobulated hypodense lesion in the right lobe of the liver likely represents a hepatic abscess versus necrotic tumor.  No evidence of appendicitis or diverticulitis.  The gallbladder is contracted with stones.  Cholecystitis is not excluded.  Extensive diverticulosis of the sigmoid colon.  Abdominal aortic aneurysm measuring up to 4.1 cm maximal AP diameter. Bladder wall thickening with bladder diverticula.   Original Report Authenticated By: Burman Nieves, M.D.   Ct Abdomen Pelvis W Contrast  08/05/2012   *RADIOLOGY REPORT*  Clinical Data:  Fever of unknown origin.  Elevated white cell count.  Pneumonia.  Right lower lobe infiltrates.  CT CHEST, ABDOMEN AND PELVIS WITH CONTRAST  Technique:  Multidetector CT imaging of the chest, abdomen and pelvis was performed following the standard protocol during bolus administration of intravenous contrast.  Contrast: OMNIPAQUE IOHEXOL 300 MG/ML  SOLN, 50mL OMNIPAQUE IOHEXOL 300 MG/ML  SOLN  Comparison:   None.  CT CHEST  Findings:  Normal heart size.  Coronary artery calcifications. Normal caliber thoracic aorta with calcification.  Great vessels are unremarkable.  No evidence of aortic dissection.  No significant lymphadenopathy in the chest.  The esophagus is decompressed.  Small bilateral pleural effusions with atelectasis or consolidation in the lung bases.  Visualization of lung fields is limited due to respiratory motion artifact but no apparent pneumothorax or interstitial changes are identified.  Airways appear patent.  Emphysematous changes and scarring in the apices. Degenerative changes in the thoracic spine.  No destructive bone lesions appreciated.  IMPRESSION: Small bilateral pleural effusions with atelectasis or consolidation in the lung bases.  CT ABDOMEN AND PELVIS   Findings:  There is a  large circumscribed loculated hypodense structure involving most of the right lobe of the liver and measuring about 9.9 x 5.9 by 8.7 cm.  There is mild peripheral enhancement.  Given the history of fevers, this may represent hepatic abscess.  Necrotic mass or metastasis is not excluded.  No bile duct dilatation.  The gallbladder is contracted with mildly thickened wall.  Gallstones are present.  No pericholecystic infiltration.  The pancreas, spleen, adrenal glands, inferior vena cava, and retroperitoneal lymph nodes are unremarkable.  Cysts in the lower pole of the right kidney, largest measuring 3.2 cm diameter.  No evidence of solid mass or hydronephrosis in either kidney.  There is an infrarenal abdominal aortic aneurysm extending to the bifurcation with involvement of the right iliac artery.  The aneurysm measures up to about 4 cm maximal AP diameter by 4.1 cm transverse diameter.  Right iliac artery measures 1.9 cm diameter. There is extensive aortoiliac calcification with mild mural thrombus. No abnormal retroperitoneal fluid collection to suggest rupture.  The stomach is decompressed.  Small bowel are decompressed.  Stool filled colon without distension.  No free air or free fluid in the abdomen.  Abdominal wall musculature appears intact although atrophic.  Pelvis:  Small amount of free fluid in the pelvis.  No loculated fluid collections are identified.  Calcification in the uterus suggesting fibroids.  No abnormal adnexal masses.  The bladder demonstrates diffusely thick walled with diverticula arising from the right lateral wall and the anterior wall of the bladder. Changes suggest cystitis versus hypertrophy due to outlet obstruction.  The appendix is normal.  There is diverticulosis of the sigmoid colon.  No inflammatory changes to suggest diverticulitis.  There are giant appearing stool filled diverticula present.  These measure up to about 3.2 cm diameter.  No significant pelvic  lymphadenopathy.  Focal subcutaneous gas collection in the anterior left abdominal wall likely representing injection site.  Degenerative changes in the lumbar spine with normal alignment.  No destructive bone lesions are appreciated.  IMPRESSION: Lobulated hypodense lesion in the right lobe of the liver likely represents a hepatic abscess versus necrotic tumor.  No evidence of appendicitis or diverticulitis.  The gallbladder is contracted with stones.  Cholecystitis is not excluded.  Extensive diverticulosis of the sigmoid colon.  Abdominal aortic aneurysm measuring up to 4.1 cm maximal AP diameter. Bladder wall thickening with bladder diverticula.   Original Report Authenticated By: Burman Nieves, M.D.   Ct Image Guided Drainage By Percutaneous Catheter  08/06/2012   *RADIOLOGY REPORT*  Clinical Data: Hepatic abscess with fever and leukocytosis.  CT GUIDED DRAINAGE OF HEPATIC ABSCESS  Sedation:  1.5 mg IV Versed;  100 mcg IV Fentanyl  Total Moderate Sedation Time: 14 minutes.  Procedure:  The procedure, risks, benefits, and alternatives were explained to the patient.  Questions regarding the procedure were encouraged and answered. The patient understands and consents to the procedure.  The right abdominal wall was prepped with Betadine in a sterile fashion, and a sterile drape was applied covering the operative field.  A sterile gown and sterile gloves were used for the procedure. Local anesthesia was provided with 1% Lidocaine.  The CT was performed in a supine and slightly oblique position with the right side rolled up.  After localizing a large hepatic abscess, an 18 gauge needle was advanced into the collection. Aspiration of fluid sample was performed with the sample sent for culture analysis.  A guidewire was advanced into the collection.  The tract was dilated and  a 12-French drainage catheter placed in the collection.  The catheter was connected to a suction bulb.  It was secured at the skin with a  Prolene retention suture and Stat- Lock device.  Complications: None  Findings: Aspiration revealed grossly purulent fluid.  The percutaneous drain was positioned in the dominant liquefied pocket posteriorly.  There is excellent drainage of fluid from the drain after placement.  IMPRESSION: CT guided drainage of hepatic abscess.  A grossly purulent fluid sample was sent for culture analysis.  A 12-French drain was placed in the abscess.  This was connected to a suction bulb.  Output will be followed.   Original Report Authenticated By: Irish Lack, M.D.     Assessment/Plan:  Hepatic Abscess (vs Tumor)  Fever, Leukocytosis  bibasilar consolidation  Recent Cx (-) UTI  Previous Microaerophilic strep bacteremia 07-30-12  Appreciate Dr Antonietta Jewel f/u  Await tissue/fluid  Leukocytosis unchanged, continued fever No change in anbx for now  Drain out 80 cc last 24h  Total days of antibiotics: 5 Zosyn 7-/20 -->  Vanco 7/20 to 7/22            [name] Infectious Diseases (pager) [phone] [email or web address] 08/07/2012, 11:12 AM  LOS: 5 days

## 2012-08-08 LAB — COMPREHENSIVE METABOLIC PANEL
Albumin: 1.6 g/dL — ABNORMAL LOW (ref 3.5–5.2)
BUN: 7 mg/dL (ref 6–23)
Chloride: 106 mEq/L (ref 96–112)
Creatinine, Ser: 0.65 mg/dL (ref 0.50–1.10)
GFR calc non Af Amer: 80 mL/min — ABNORMAL LOW (ref >90)
Total Bilirubin: 1 mg/dL (ref 0.3–1.2)

## 2012-08-08 LAB — CBC
HCT: 24.8 % — ABNORMAL LOW (ref 36.0–46.0)
MCV: 78.7 fL (ref 78.0–100.0)
RDW: 15.4 % (ref 11.5–15.5)
WBC: 12.1 10*3/uL — ABNORMAL HIGH (ref 4.0–10.5)

## 2012-08-08 LAB — AFP TUMOR MARKER: AFP-Tumor Marker: 2.1 ng/mL (ref 0.0–8.0)

## 2012-08-08 LAB — CULTURE, BLOOD (ROUTINE X 2): Culture: NO GROWTH

## 2012-08-08 NOTE — Progress Notes (Signed)
PT Cancellation Note  Patient Details Name: Sydney Scott MRN: [id number] DOB: [birth year]   Cancelled Treatment:    Reason Eval/Treat Not Completed: Other (comment) (pt with out of town visitors ).  Encouraged her to walk in hallways later with RN staff.  PT to check back in AM as time allows.  Thanks,    Rollene Rotunda. Rafe Mackowski, PT, DPT [phone]   08/08/2012, 3:42 PM

## 2012-08-08 NOTE — Progress Notes (Signed)
Patient ID: Sydney Scott, female   DOB: 14-Jul-1927, 79 y.o.   MRN: [id number]         [facility]    Date of Admission:  08/02/2012           Day 7 piperacillin tazobactam  Active Problems:   Leukocytosis, unspecified   Anemia   Hypokalemia   HAP (hospital-acquired pneumonia)   Bacteremia   Liver abscess   . heparin  5,000 Units Subcutaneous Q8H  . piperacillin-tazobactam (ZOSYN)  IV  3.375 g Intravenous Q8H  . potassium chloride  40 mEq Oral BID  . sodium chloride  3 mL Intravenous Q12H    Objective: Temp:  [98.5 F (36.9 C)-99 F (37.2 C)] 99 F (37.2 C) (07/26 0600) Pulse Rate:  [83-88] 84 (07/26 0600) Resp:  [18-20] 20 (07/26 0600) BP: (124-166)/(74-85) 124/74 mmHg (07/26 0600) SpO2:  [93 %-95 %] 93 % (07/26 0600)  Lab Results Lab Results  Component Value Date   WBC 12.1* 08/08/2012   HGB 9.0* 08/08/2012   HCT 24.8* 08/08/2012   MCV 78.7 08/08/2012   PLT 397 08/08/2012       Microbiology: Recent Results (from the past 240 hour(s))  CULTURE, BLOOD (ROUTINE X 2)     Status: None   Collection Time    08/02/12  4:40 PM      Result Value Range Status   Specimen Description BLOOD RIGHT HAND   Final   Special Requests NONE   Final   Culture  Setup Time 08/02/2012 20:54   Final   Culture     Final   Value:        BLOOD CULTURE RECEIVED NO GROWTH TO DATE CULTURE WILL BE HELD FOR 5 DAYS BEFORE ISSUING A FINAL NEGATIVE REPORT   Report Status PENDING   Incomplete  CULTURE, BLOOD (ROUTINE X 2)     Status: None   Collection Time    08/02/12  5:34 PM      Result Value Range Status   Specimen Description BLOOD LEFT ANTECUBITAL   Final   Special Requests BOTTLES DRAWN AEROBIC AND ANAEROBIC 4CC   Final   Culture  Setup Time 08/02/2012 20:47   Final   Culture     Final   Value:        BLOOD CULTURE RECEIVED NO GROWTH TO DATE CULTURE WILL BE HELD FOR 5 DAYS BEFORE ISSUING A FINAL NEGATIVE REPORT   Report Status PENDING   Incomplete  URINE  CULTURE     Status: None   Collection Time    08/02/12  6:41 PM      Result Value Range Status   Specimen Description URINE, CLEAN CATCH   Final   Special Requests NONE   Final   Culture  Setup Time 08/03/2012 03:38   Final   Colony Count NO GROWTH   Final   Culture NO GROWTH   Final   Report Status 08/04/2012 FINAL   Final  CULTURE, ROUTINE-ABSCESS     Status: None   Collection Time    08/06/12  3:31 PM      Result Value Range Status   Specimen Description LIVER ABSCESS   Final   Special Requests Normal   Final   Gram Stain     Final   Value: NO WBC SEEN     NO SQUAMOUS EPITHELIAL CELLS SEEN     NO ORGANISMS SEEN   Culture NO GROWTH 1 DAY   Final  Report Status PENDING   Incomplete  ANAEROBIC CULTURE     Status: None   Collection Time    08/06/12  3:31 PM      Result Value Range Status   Specimen Description LIVER ABSCESS   Final   Special Requests Normal   Final   Gram Stain     Final   Value: NO WBC SEEN     NO SQUAMOUS EPITHELIAL CELLS SEEN     NO ORGANISMS SEEN   Culture     Final   Value: NO ANAEROBES ISOLATED; CULTURE IN PROGRESS FOR 5 DAYS   Report Status PENDING   Incomplete   Plan: 1. Continue piperacillin tazobactam pending final culture results  Cliffton Asters, MD [facility] [phone] pager   [phone] cell 08/08/2012, 12:41 PM

## 2012-08-08 NOTE — Progress Notes (Signed)
Subjective: Pt without sig c/o this am; has mild soreness at RUQ drain site  Objective: Vital signs in last 24 hours: Temp:  [98.5 F (36.9 C)-99 F (37.2 C)] 99 F (37.2 C) (07/26 0600) Pulse Rate:  [83-88] 84 (07/26 0600) Resp:  [18-20] 20 (07/26 0600) BP: (124-166)/(74-85) 124/74 mmHg (07/26 0600) SpO2:  [93 %-95 %] 93 % (07/26 0600) Last BM Date: 08/07/12  Intake/Output from previous day: 07/25 0701 - 07/26 0700 In: 30  Out: 70 [Drains:70] Intake/Output this shift: Total I/O In: -  Out: 20 [Drains:20]  Hepatic drain intact, insertion site ok, mildly tender; output 20 cc's today, 70 cc's yesterday; cx's /cyt pending  Lab Results:   Recent Labs  08/07/12 0505 08/08/12 0536  WBC 17.5* 12.1*  HGB 8.9* 9.0*  HCT 24.5* 24.8*  PLT 387 397   BMET  Recent Labs  08/07/12 0505 08/08/12 0536  NA 139 139  K 4.5 4.1  CL 106 106  CO2 25 25  GLUCOSE 110* 94  BUN 6 7  CREATININE 0.56 0.65  CALCIUM 8.5 8.3*   PT/INR No results found for this basename: LABPROT, INR,  in the last 72 hours ABG No results found for this basename: PHART, PCO2, PO2, HCO3,  in the last 72 hours  Studies/Results: Ct Image Guided Drainage By Percutaneous Catheter  08/06/2012   *RADIOLOGY REPORT*  Clinical Data: Hepatic abscess with fever and leukocytosis.  CT GUIDED DRAINAGE OF HEPATIC ABSCESS  Sedation:  1.5 mg IV Versed;  100 mcg IV Fentanyl  Total Moderate Sedation Time: 14 minutes.  Procedure:  The procedure, risks, benefits, and alternatives were explained to the patient.  Questions regarding the procedure were encouraged and answered. The patient understands and consents to the procedure.  The right abdominal wall was prepped with Betadine in a sterile fashion, and a sterile drape was applied covering the operative field.  A sterile gown and sterile gloves were used for the procedure. Local anesthesia was provided with 1% Lidocaine.  The CT was performed in a supine and slightly oblique  position with the right side rolled up.  After localizing a large hepatic abscess, an 18 gauge needle was advanced into the collection. Aspiration of fluid sample was performed with the sample sent for culture analysis.  A guidewire was advanced into the collection.  The tract was dilated and a 12-French drainage catheter placed in the collection.  The catheter was connected to a suction bulb.  It was secured at the skin with a Prolene retention suture and Stat- Lock device.  Complications: None  Findings: Aspiration revealed grossly purulent fluid.  The percutaneous drain was positioned in the dominant liquefied pocket posteriorly.  There is excellent drainage of fluid from the drain after placement.  IMPRESSION: CT guided drainage of hepatic abscess.  A grossly purulent fluid sample was sent for culture analysis.  A 12-French drain was placed in the abscess.  This was connected to a suction bulb.  Output will be followed.   Original Report Authenticated By: Irish Lack, M.D.   Results for orders placed during the hospital encounter of 08/02/12  CULTURE, BLOOD (ROUTINE X 2)     Status: None   Collection Time    08/02/12  4:40 PM      Result Value Range Status   Specimen Description BLOOD RIGHT HAND   Final   Special Requests NONE   Final   Culture  Setup Time 08/02/2012 20:54   Final   Culture  Final   Value:        BLOOD CULTURE RECEIVED NO GROWTH TO DATE CULTURE WILL BE HELD FOR 5 DAYS BEFORE ISSUING A FINAL NEGATIVE REPORT   Report Status PENDING   Incomplete  CULTURE, BLOOD (ROUTINE X 2)     Status: None   Collection Time    08/02/12  5:34 PM      Result Value Range Status   Specimen Description BLOOD LEFT ANTECUBITAL   Final   Special Requests BOTTLES DRAWN AEROBIC AND ANAEROBIC 4CC   Final   Culture  Setup Time 08/02/2012 20:47   Final   Culture     Final   Value:        BLOOD CULTURE RECEIVED NO GROWTH TO DATE CULTURE WILL BE HELD FOR 5 DAYS BEFORE ISSUING A FINAL NEGATIVE REPORT    Report Status PENDING   Incomplete  URINE CULTURE     Status: None   Collection Time    08/02/12  6:41 PM      Result Value Range Status   Specimen Description URINE, CLEAN CATCH   Final   Special Requests NONE   Final   Culture  Setup Time 08/03/2012 03:38   Final   Colony Count NO GROWTH   Final   Culture NO GROWTH   Final   Report Status 08/04/2012 FINAL   Final  CULTURE, ROUTINE-ABSCESS     Status: None   Collection Time    08/06/12  3:31 PM      Result Value Range Status   Specimen Description LIVER ABSCESS   Final   Special Requests Normal   Final   Gram Stain     Final   Value: NO WBC SEEN     NO SQUAMOUS EPITHELIAL CELLS SEEN     NO ORGANISMS SEEN   Culture NO GROWTH 1 DAY   Final   Report Status PENDING   Incomplete  ANAEROBIC CULTURE     Status: None   Collection Time    08/06/12  3:31 PM      Result Value Range Status   Specimen Description LIVER ABSCESS   Final   Special Requests Normal   Final   Gram Stain     Final   Value: NO WBC SEEN     NO SQUAMOUS EPITHELIAL CELLS SEEN     NO ORGANISMS SEEN   Culture     Final   Value: NO ANAEROBES ISOLATED; CULTURE IN PROGRESS FOR 5 DAYS   Report Status PENDING   Incomplete    Anti-infectives: Anti-infectives   Start     Dose/Rate Route Frequency Ordered Stop   08/04/12 0000  vancomycin (VANCOCIN) IVPB 1000 mg/200 mL premix  Status:  Discontinued     1,000 mg 200 mL/hr over 60 Minutes Intravenous Every 12 hours 08/04/12 1920 08/04/12 2037   08/03/12 2200  metroNIDAZOLE (FLAGYL) tablet 1,000 mg     1,000 mg Oral  Once 08/03/12 1159 08/03/12 2150   08/03/12 1000  metroNIDAZOLE (FLAGYL) tablet 1,000 mg     1,000 mg Oral  Once 08/03/12 0901 08/03/12 0942   08/03/12 0200  piperacillin-tazobactam (ZOSYN) IVPB 3.375 g     3.375 g 12.5 mL/hr over 240 Minutes Intravenous Every 8 hours 08/02/12 1710     08/02/12 1800  vancomycin (VANCOCIN) 500 mg in sodium chloride 0.9 % 100 mL IVPB     500 mg 100 mL/hr over 60 Minutes  Intravenous Every 12 hours 08/02/12 1710 08/04/12 1931  08/02/12 1700  piperacillin-tazobactam (ZOSYN) IVPB 3.375 g     3.375 g 100 mL/hr over 30 Minutes Intravenous  Once 08/02/12 1656 08/02/12 1859      Assessment/Plan: s/p hepatic abscess drainage 7/24; check final cx's /cytology; monitor labs ;cont drain irrigation;  have discussed need for collection of drain contents for cytology with nursing; check AFP; check f/u CT next week  LOS: 6 days    Taren Toops,D [facility] 08/08/2012

## 2012-08-08 NOTE — Progress Notes (Signed)
[facility] PROGRESS NOTE  Sydney Scott YNW:[id number] DOB: 1927/11/24 DOA: 08/02/2012 PCP: No PCP Per Patient  Brief narrative: This is an 79 year old female, with known history of OA, anemia, hospitalized for culture-negative UTI 07/27/12-07/28/12 and subsequently discharged on Ciprofloxacin. According patient she has been compliant with her antibiotics, and has only 2 pills left. Apparently, since then, patient has become progressively weaker, with poor appetite. She denies subjective fever or chills, denies SOB or cough. Review of EMR reveals that blood cultures of 07/27/12. Grew microaerophilic streptococci in 1:3 bottles. CXR in the ED revealed a right basilar opacity. Wcc was 19.3, temp 99.2.   Assessment/Plan: Active Problems:   Leukocytosis, unspecified   Anemia   Hypokalemia   HAP (hospital-acquired pneumonia)   Bacteremia   Liver abscess  1. HAP (hospital-acquired pneumonia): Patient presented with progressive weakness, low grade pyrexia and FTT, about 5 days post-hospitalization for UTI, despite compliance with Ciprofloxacin therapy. Wcc was elevated at 19.3, with neutrophilia. CXR confirmed RLL pneumonia. This clinical scenario is consistent with HAP. Managed with iv Vancomycin/Zosyn, as well as supportive treatment. Patient does not look toxic, however, has continued intermittent fever which may be secondary to abdominal process too. Defervesced. Patient feels clinically much better. Repeat CXR of 08/04/12, showed stable findings. Chest CT scan of 08/05/12, showed small bilateral pleural effusions with atelectasis or consolidation in the lung bases. Now day #7 antibiotics. Patient currently has no respiratory tract symptoms.  2. Bacteremia (07/27/12)/Hepatic abscess (R/O Tumor): Review of EMR revealed that blood cultures of 07/27/12, grew microarophilic streptococcus in 1:3 bottles. It is not clear what role this may have had in current presentation. Above antibiotic choice should  adequately cover this pathogen. Repeat blood cultures are negative so far. In view of recrudescence of fever, 2D Echocardiogram was done on 08/03/12, and this showed normal LV cavity size, mild concentric hypertrophy, EF of 55% to 60% and no regional wall motion abnormalities. The atrium was mildly dilated and valve were sclerotic. Dr Judyann Munson provided initial ID consultation. Managing as recommended. She discontinued Vancomycin on 08/04/12. Patient was subsequently seen by Dr Tinnie Gens hatcher, and he recommended Chest and Abdominal/Pelvic CT scan. Chest CT scan findings are described in #1 above. Abdominal/Pelvic CT on the other hand, revealed a large circumscribed loculated hypodense structure involving most of the right lobe of the liver and measuring about 9.9 x 5.9 by 8.7 cm. There is mild peripheral enhancement. General surgery and interventional radiology consulted. Patient is status post aspiration of hepatic abscess on 08/06/12 - has RUQ drain. No organisms on gram stain. Continue Zosyn pending final culture results. Follow cytology results. 3. Hypokalemia: Potassium was 2.7 on admission. Patient is on no culprit medication, so etiology is unclear. No doubt contributory to generalized weakness. Magnesium level is normal. TSH is 1.214. Repleted as indicated. Resolved.  4. Anemia: This is mild, microcytic. Anemia panel revealed iron deficiency. FOBT is pending. Patient will benefit from outpatient colonoscopy. Stable. 5. Recent UTI: This was diagnosed on 07/27/12, and was culture-negative. Patient has completed a total of 7 days antibiotics, for this. Repeat U/A has revealed persistent pyuria and bacteriuria. Adequately addressed with current antibiotic choice. Urine culture is negative so far.  6. Trichomoniasis: This was an incidental finding on U/A. Addressed with 2g Flagyl PO on 08/03/12. HIV test and RPR are negative.  7. Leukocytosis: secondary to # 1&2. Monitor. 8. Cholelithiasis and extensive  sigmoid diverticulosis: seen on CT abdomen. 9. Abdominal Aortic Aneurysm: seen on CT. 4.1 cm  AP diameter. Outpatient followup.   Code Status: Full Code.  Family Communication: None Disposition Plan: To be determined.    Consultants:  ID  Surgery  IR   Procedures:  CXR.   2D Echocardiogram.   CT-guided drainage of hepatic abscess and placement of 12 French drain on 08/06/12  Antibiotics:  Vancomycin 08/02/12-08/04/12  Zosyn 08/02/12>>>  HPI/Subjective: Mild soreness in right upper abdomen. Denies nausea or vomiting.   Objective: Vital signs in last 24 hours: Temp:  [98.5 F (36.9 C)-99 F (37.2 C)] 99 F (37.2 C) (07/26 0600) Pulse Rate:  [83-88] 84 (07/26 0600) Resp:  [18-20] 20 (07/26 0600) BP: (124-166)/(74-85) 124/74 mmHg (07/26 0600) SpO2:  [93 %-95 %] 93 % (07/26 0600) Weight change:  Last BM Date: 08/07/12  Intake/Output from previous day: 07/25 0701 - 07/26 0700 In: 30  Out: 70 [Drains:70] Total I/O In: -  Out: 20 [Drains:20]   Physical Exam: General: Comfortable.  CHEST: Clinically clear to auscultation, no wheezes, no crackles. No increased work of breathing. HEART: Sounds 1 and 2 heard, normal, regular, no murmurs. No JVD or pedal edema. ABDOMEN: Full, soft, no palpable organomegaly, no palpable masses, normal bowel sounds. RUQ drain-minimal purulent fluid in bulb. Mild RUQ tenderness but without rigidity, guarding or rebound. LOWER EXTREMITIES: No pitting edema, palpable peripheral pulses.  CENTRAL NERVOUS SYSTEM: No focal neurologic deficit on gross examination. Alert and oriented.  Lab Results:  Recent Labs  08/07/12 0505 08/08/12 0536  WBC 17.5* 12.1*  HGB 8.9* 9.0*  HCT 24.5* 24.8*  PLT 387 397    Recent Labs  08/07/12 0505 08/08/12 0536  NA 139 139  K 4.5 4.1  CL 106 106  CO2 25 25  GLUCOSE 110* 94  BUN 6 7  CREATININE 0.56 0.65  CALCIUM 8.5 8.3*   Recent Results (from the past 240 hour(s))  CULTURE, BLOOD (ROUTINE  X 2)     Status: None   Collection Time    08/02/12  4:40 PM      Result Value Range Status   Specimen Description BLOOD RIGHT HAND   Final   Special Requests NONE   Final   Culture  Setup Time 08/02/2012 20:54   Final   Culture NO GROWTH 5 DAYS   Final   Report Status 08/08/2012 FINAL   Final  CULTURE, BLOOD (ROUTINE X 2)     Status: None   Collection Time    08/02/12  5:34 PM      Result Value Range Status   Specimen Description BLOOD LEFT ANTECUBITAL   Final   Special Requests BOTTLES DRAWN AEROBIC AND ANAEROBIC 4CC   Final   Culture  Setup Time 08/02/2012 20:47   Final   Culture NO GROWTH 5 DAYS   Final   Report Status 08/08/2012 FINAL   Final  URINE CULTURE     Status: None   Collection Time    08/02/12  6:41 PM      Result Value Range Status   Specimen Description URINE, CLEAN CATCH   Final   Special Requests NONE   Final   Culture  Setup Time 08/03/2012 03:38   Final   Colony Count NO GROWTH   Final   Culture NO GROWTH   Final   Report Status 08/04/2012 FINAL   Final  CULTURE, ROUTINE-ABSCESS     Status: None   Collection Time    08/06/12  3:31 PM      Result Value Range Status  Specimen Description LIVER ABSCESS   Final   Special Requests Normal   Final   Gram Stain     Final   Value: NO WBC SEEN     NO SQUAMOUS EPITHELIAL CELLS SEEN     NO ORGANISMS SEEN   Culture NO GROWTH 1 DAY   Final   Report Status PENDING   Incomplete  ANAEROBIC CULTURE     Status: None   Collection Time    08/06/12  3:31 PM      Result Value Range Status   Specimen Description LIVER ABSCESS   Final   Special Requests Normal   Final   Gram Stain     Final   Value: NO WBC SEEN     NO SQUAMOUS EPITHELIAL CELLS SEEN     NO ORGANISMS SEEN   Culture     Final   Value: NO ANAEROBES ISOLATED; CULTURE IN PROGRESS FOR 5 DAYS   Report Status PENDING   Incomplete     Studies/Results: Ct Image Guided Drainage By Percutaneous Catheter  08/06/2012   *RADIOLOGY REPORT*  Clinical Data: Hepatic  abscess with fever and leukocytosis.  CT GUIDED DRAINAGE OF HEPATIC ABSCESS  Sedation:  1.5 mg IV Versed;  100 mcg IV Fentanyl  Total Moderate Sedation Time: 14 minutes.  Procedure:  The procedure, risks, benefits, and alternatives were explained to the patient.  Questions regarding the procedure were encouraged and answered. The patient understands and consents to the procedure.  The right abdominal wall was prepped with Betadine in a sterile fashion, and a sterile drape was applied covering the operative field.  A sterile gown and sterile gloves were used for the procedure. Local anesthesia was provided with 1% Lidocaine.  The CT was performed in a supine and slightly oblique position with the right side rolled up.  After localizing a large hepatic abscess, an 18 gauge needle was advanced into the collection. Aspiration of fluid sample was performed with the sample sent for culture analysis.  A guidewire was advanced into the collection.  The tract was dilated and a 12-French drainage catheter placed in the collection.  The catheter was connected to a suction bulb.  It was secured at the skin with a Prolene retention suture and Stat- Lock device.  Complications: None  Findings: Aspiration revealed grossly purulent fluid.  The percutaneous drain was positioned in the dominant liquefied pocket posteriorly.  There is excellent drainage of fluid from the drain after placement.  IMPRESSION: CT guided drainage of hepatic abscess.  A grossly purulent fluid sample was sent for culture analysis.  A 12-French drain was placed in the abscess.  This was connected to a suction bulb.  Output will be followed.   Original Report Authenticated By: Irish Lack, M.D.    Medications: Scheduled Meds: . heparin  5,000 Units Subcutaneous Q8H  . piperacillin-tazobactam (ZOSYN)  IV  3.375 g Intravenous Q8H  . potassium chloride  40 mEq Oral BID  . sodium chloride  3 mL Intravenous Q12H   Continuous Infusions:   PRN  Meds:.sodium chloride, acetaminophen, acetaminophen, albuterol, morphine injection, ondansetron (ZOFRAN) IV, ondansetron, oxyCODONE, sodium chloride    LOS: 6 days   [facility]  [facility] Pager [phone]. If 8PM-8AM, please contact night-coverage at [email or web address], password [facility] 08/08/2012, 1:49 PM  LOS: 6 days

## 2012-08-09 NOTE — Progress Notes (Signed)
Patient ID: Sydney Scott, female   DOB: May 17, 1927, 79 y.o.   MRN: [id number]         [facility]    Date of Admission:  08/02/2012     Active Problems:   Leukocytosis, unspecified   Anemia   Hypokalemia   HAP (hospital-acquired pneumonia)   Bacteremia   Liver abscess   . heparin  5,000 Units Subcutaneous Q8H  . piperacillin-tazobactam (ZOSYN)  IV  3.375 g Intravenous Q8H  . potassium chloride  40 mEq Oral BID  . sodium chloride  3 mL Intravenous Q12H   Objective: Temp:  [98.6 F (37 C)-99.4 F (37.4 C)] 99 F (37.2 C) (07/27 0600) Pulse Rate:  [87-97] 87 (07/27 0600) Resp:  [18-20] 18 (07/27 0600) BP: (123-141)/(70-80) 141/80 mmHg (07/27 0600) SpO2:  [94 %-96 %] 94 % (07/27 0600)  Lab Results Lab Results  Component Value Date   WBC 12.1* 08/08/2012   HGB 9.0* 08/08/2012   HCT 24.8* 08/08/2012   MCV 78.7 08/08/2012   PLT 397 08/08/2012     Microbiology: Recent Results (from the past 240 hour(s))  CULTURE, BLOOD (ROUTINE X 2)     Status: None   Collection Time    08/02/12  4:40 PM      Result Value Range Status   Specimen Description BLOOD RIGHT HAND   Final   Special Requests NONE   Final   Culture  Setup Time 08/02/2012 20:54   Final   Culture NO GROWTH 5 DAYS   Final   Report Status 08/08/2012 FINAL   Final  CULTURE, BLOOD (ROUTINE X 2)     Status: None   Collection Time    08/02/12  5:34 PM      Result Value Range Status   Specimen Description BLOOD LEFT ANTECUBITAL   Final   Special Requests BOTTLES DRAWN AEROBIC AND ANAEROBIC 4CC   Final   Culture  Setup Time 08/02/2012 20:47   Final   Culture NO GROWTH 5 DAYS   Final   Report Status 08/08/2012 FINAL   Final  URINE CULTURE     Status: None   Collection Time    08/02/12  6:41 PM      Result Value Range Status   Specimen Description URINE, CLEAN CATCH   Final   Special Requests NONE   Final   Culture  Setup Time 08/03/2012 03:38   Final   Colony Count NO GROWTH   Final   Culture NO GROWTH   Final   Report Status 08/04/2012 FINAL   Final  CULTURE, ROUTINE-ABSCESS     Status: None   Collection Time    08/06/12  3:31 PM      Result Value Range Status   Specimen Description LIVER ABSCESS   Final   Special Requests Normal   Final   Gram Stain     Final   Value: NO WBC SEEN     NO SQUAMOUS EPITHELIAL CELLS SEEN     NO ORGANISMS SEEN   Culture NO GROWTH 1 DAY   Final   Report Status PENDING   Incomplete  ANAEROBIC CULTURE     Status: None   Collection Time    08/06/12  3:31 PM      Result Value Range Status   Specimen Description LIVER ABSCESS   Final   Special Requests Normal   Final   Gram Stain     Final   Value: NO WBC SEEN  NO SQUAMOUS EPITHELIAL CELLS SEEN     NO ORGANISMS SEEN   Culture     Final   Value: NO ANAEROBES ISOLATED; CULTURE IN PROGRESS FOR 5 DAYS   Report Status PENDING   Incomplete   Assessment: Her liver abscess culture remains negative so far.  Plan: 1. Continue piperacillin tazobactam pending final culture results  Cliffton Asters, MD [facility] for Infect[facility] Disease [facility] [phone] pager   [phone] cell 08/09/2012, 12:31 PM

## 2012-08-09 NOTE — Progress Notes (Signed)
[facility] PROGRESS NOTE  Kairi Harshbarger ZOX:[id number] DOB: [birth year] DOA: 08/02/2012 PCP: No PCP Per Patient  Brief narrative: This is an 79 year old female, with known history of OA, anemia, hospitalized for culture-negative UTI 07/27/12-07/28/12 and subsequently discharged on Ciprofloxacin. According patient she has been compliant with her antibiotics, and has only 2 pills left. Apparently, since then, patient has become progressively weaker, with poor appetite. She denies subjective fever or chills, denies SOB or cough. Review of EMR reveals that blood cultures of 07/27/12. Grew microaerophilic streptococci in 1:3 bottles. CXR in the ED revealed a right basilar opacity. Wcc was 19.3, temp 99.2.   Assessment/Plan: Active Problems:   Leukocytosis, unspecified   Anemia   Hypokalemia   HAP (hospital-acquired pneumonia)   Bacteremia   Liver abscess  1. HAP (hospital-acquired pneumonia): Patient presented with progressive weakness, low grade pyrexia and FTT, about 5 days post-hospitalization for UTI, despite compliance with Ciprofloxacin therapy. Wcc was elevated at 19.3, with neutrophilia. CXR confirmed RLL pneumonia. This clinical scenario is consistent with HAP. Managed with iv Vancomycin/Zosyn, as well as supportive treatment. Patient does not look toxic, however, has continued intermittent fever which may be secondary to abdominal process too. Defervesced. Patient feels clinically much better. Repeat CXR of 08/04/12, showed stable findings. Chest CT scan of 08/05/12, showed small bilateral pleural effusions with atelectasis or consolidation in the lung bases. Now day #8 antibiotics. Patient currently has no respiratory tract symptoms. Completed Abx for this indication. 2. Bacteremia (07/27/12)/Hepatic abscess (R/O Tumor): Review of EMR revealed that blood cultures of 07/27/12, grew microarophilic streptococcus in 1:3 bottles. It is not clear what role this may have had in current presentation.  Repeat blood cultures are negative. In view of recrudescence of fever, 2D Echocardiogram was done on 08/03/12, and this showed normal LV cavity size, mild concentric hypertrophy, EF of 55% to 60% and no regional wall motion abnormalities. The atrium was mildly dilated and valve were sclerotic. Dr Judyann Munson provided initial ID consultation. Managing as recommended. She discontinued Vancomycin on 08/04/12. Patient was subsequently seen by Dr Tinnie Gens hatcher, and he recommended Chest and Abdominal/Pelvic CT scan. Chest CT scan findings are described in #1 above. Abdominal/Pelvic CT on the other hand, revealed a large circumscribed loculated hypodense structure involving most of the right lobe of the liver and measuring about 9.9 x 5.9 by 8.7 cm. There is mild peripheral enhancement. General surgery and interventional radiology consulted. Patient is status post aspiration of hepatic abscess on 08/06/12 - has RUQ drain. No organisms on gram stain. Continue Zosyn pending final culture results. Follow cytology results. Per IR, FU CT next week. Decreasing drain output. 3. Hypokalemia: Potassium was 2.7 on admission. Patient is on no culprit medication, so etiology is unclear. No doubt contributory to generalized weakness. Magnesium level is normal. TSH is 1.214. Repleted as indicated. Resolved.  4. Anemia: This is mild, microcytic. Anemia panel revealed iron deficiency. FOBT is pending. Patient will benefit from outpatient colonoscopy. Stable. 5. Recent UTI: This was diagnosed on 07/27/12, and was culture-negative. Patient has completed a total of 7 days antibiotics, for this. Repeat U/A has revealed persistent pyuria and bacteriuria. Adequately addressed with current antibiotic choice. Urine culture is negative so far.  6. Trichomoniasis: This was an incidental finding on U/A. Addressed with 2g Flagyl PO on 08/03/12. HIV test and RPR are negative.  7. Leukocytosis: secondary to # 1&2. Monitor. 8. Cholelithiasis and  extensive sigmoid diverticulosis: seen on CT abdomen. 9. Abdominal Aortic Aneurysm: seen on  CT. 4.1 cm AP diameter. Outpatient followup.   Code Status: Full Code.  Family Communication: None Disposition Plan: To be determined.    Consultants:  ID  Surgery  IR   Procedures:  CXR.   2D Echocardiogram.   CT-guided drainage of hepatic abscess and placement of 12 French drain on 08/06/12  Antibiotics:  Vancomycin 08/02/12-08/04/12  Zosyn 08/02/12>>>  HPI/Subjective: Mild soreness in right upper abdomen. Denies nausea or vomiting.   Objective: Vital signs in last 24 hours: Temp:  [98.6 F (37 C)-99.4 F (37.4 C)] 99 F (37.2 C) (07/27 0600) Pulse Rate:  [87-97] 87 (07/27 0600) Resp:  [18-20] 18 (07/27 0600) BP: (123-141)/(70-80) 141/80 mmHg (07/27 0600) SpO2:  [94 %-96 %] 94 % (07/27 0600) Weight change:  Last BM Date: 08/07/12  Intake/Output from previous day: 07/26 0701 - 07/27 0700 In: 80 [IV Piggyback:50] Out: 35 [Drains:35]     Physical Exam: General: Comfortable.  Sitting on chair CHEST: Clinically clear to auscultation, no wheezes, no crackles. No increased work of breathing. HEART: Sounds 1 and 2 heard, normal, regular, no murmurs. No JVD or pedal edema. ABDOMEN: Full, soft, no palpable organomegaly, no palpable masses, normal bowel sounds. RUQ drain-minimal purulent fluid (clearing up) in bulb. Mild RUQ tenderness but without rigidity, guarding or rebound. LOWER EXTREMITIES: No pitting edema, palpable peripheral pulses.  CENTRAL NERVOUS SYSTEM: No focal neurologic deficit on gross examination. Alert and oriented.  Lab Results:  Recent Labs  08/07/12 0505 08/08/12 0536  WBC 17.5* 12.1*  HGB 8.9* 9.0*  HCT 24.5* 24.8*  PLT 387 397    Recent Labs  08/07/12 0505 08/08/12 0536  NA 139 139  K 4.5 4.1  CL 106 106  CO2 25 25  GLUCOSE 110* 94  BUN 6 7  CREATININE 0.56 0.65  CALCIUM 8.5 8.3*   Recent Results (from the past 240 hour(s))   CULTURE, BLOOD (ROUTINE X 2)     Status: None   Collection Time    08/02/12  4:40 PM      Result Value Range Status   Specimen Description BLOOD RIGHT HAND   Final   Special Requests NONE   Final   Culture  Setup Time 08/02/2012 20:54   Final   Culture NO GROWTH 5 DAYS   Final   Report Status 08/08/2012 FINAL   Final  CULTURE, BLOOD (ROUTINE X 2)     Status: None   Collection Time    08/02/12  5:34 PM      Result Value Range Status   Specimen Description BLOOD LEFT ANTECUBITAL   Final   Special Requests BOTTLES DRAWN AEROBIC AND ANAEROBIC 4CC   Final   Culture  Setup Time 08/02/2012 20:47   Final   Culture NO GROWTH 5 DAYS   Final   Report Status 08/08/2012 FINAL   Final  URINE CULTURE     Status: None   Collection Time    08/02/12  6:41 PM      Result Value Range Status   Specimen Description URINE, CLEAN CATCH   Final   Special Requests NONE   Final   Culture  Setup Time 08/03/2012 03:38   Final   Colony Count NO GROWTH   Final   Culture NO GROWTH   Final   Report Status 08/04/2012 FINAL   Final  CULTURE, ROUTINE-ABSCESS     Status: None   Collection Time    08/06/12  3:31 PM      Result Value Range  Status   Specimen Description LIVER ABSCESS   Final   Special Requests Normal   Final   Gram Stain     Final   Value: NO WBC SEEN     NO SQUAMOUS EPITHELIAL CELLS SEEN     NO ORGANISMS SEEN   Culture NO GROWTH 1 DAY   Final   Report Status PENDING   Incomplete  ANAEROBIC CULTURE     Status: None   Collection Time    08/06/12  3:31 PM      Result Value Range Status   Specimen Description LIVER ABSCESS   Final   Special Requests Normal   Final   Gram Stain     Final   Value: NO WBC SEEN     NO SQUAMOUS EPITHELIAL CELLS SEEN     NO ORGANISMS SEEN   Culture     Final   Value: NO ANAEROBES ISOLATED; CULTURE IN PROGRESS FOR 5 DAYS   Report Status PENDING   Incomplete     Studies/Results: No results found.  Medications: Scheduled Meds: . heparin  5,000 Units  Subcutaneous Q8H  . piperacillin-tazobactam (ZOSYN)  IV  3.375 g Intravenous Q8H  . potassium chloride  40 mEq Oral BID  . sodium chloride  3 mL Intravenous Q12H   Continuous Infusions:   PRN Meds:.sodium chloride, acetaminophen, acetaminophen, albuterol, morphine injection, ondansetron (ZOFRAN) IV, ondansetron, oxyCODONE, sodium chloride    LOS: 7 days   [facility]  [facility] Pager [phone]. If 8PM-8AM, please contact night-coverage at [email or web address], password [facility] 08/09/2012, 1:01 PM  LOS: 7 days

## 2012-08-09 NOTE — Progress Notes (Signed)
Physical Therapy Treatment Patient Details Name: Sydney Scott MRN: [id number] DOB: [birth year] Today's Date: 08/09/2012 Time: 4098-1191 PT Time Calculation (min): 12 min  PT Assessment / Plan / Recommendation  History of Present Illness     Clinical Impression    PT Comments     Follow Up Recommendations  No PT follow up     Does the patient have the potential to tolerate intense rehabilitation     Barriers to Discharge        Equipment Recommendations  None recommended by PT    Recommendations for Other Services OT consult  Frequency Min 3X/week   Progress towards PT Goals Progress towards PT goals: Progressing toward goals  Plan Current plan remains appropriate    Precautions / Restrictions Precautions Precautions: None Restrictions Weight Bearing Restrictions: No   Pertinent Vitals/Pain Min c/o pain    Mobility  Bed Mobility Bed Mobility: Supine to Sit Supine to Sit: 6: Modified independent (Device/Increase time) Transfers Transfers: Sit to Stand;Stand to Sit Sit to Stand: 6: Modified independent (Device/Increase time);5: Supervision;From bed Stand to Sit: 6: Modified independent (Device/Increase time);5: Supervision;To chair/3-in-1 Ambulation/Gait Ambulation/Gait Assistance: 5: Supervision;4: Min guard Ambulation Distance (Feet): 700 Feet Assistive device: None Ambulation/Gait Assistance Details: min cues for redirection Gait Pattern: Step-through pattern Gait velocity: WFL    Exercises     PT Diagnosis:    PT Problem List:   PT Treatment Interventions:     PT Goals (current goals can now be found in the care plan section) Acute Rehab PT Goals Patient Stated Goal: regain independence. home PT Goal Formulation: With patient Time For Goal Achievement: 08/17/12 Potential to Achieve Goals: Good  Visit Information  Last PT Received On: 08/09/12 Assistance Needed: +1    Subjective Data  Subjective: I have been waiting for you Patient Stated Goal:  regain independence. home   Cognition  Cognition Arousal/Alertness: Awake/alert Behavior During Therapy: WFL for tasks assessed/performed Overall Cognitive Status: Within Functional Limits for tasks assessed    Balance     End of Session PT - End of Session Equipment Utilized During Treatment: Gait belt Activity Tolerance: Patient tolerated treatment well Patient left: in chair;with call bell/phone within reach Nurse Communication: Mobility status   GP     Craig Wisnewski 08/09/2012, 12:25 PM

## 2012-08-10 DIAGNOSIS — D72829 Elevated white blood cell count, unspecified: Secondary | ICD-10-CM

## 2012-08-10 DIAGNOSIS — B954 Other streptococcus as the cause of diseases classified elsewhere: Secondary | ICD-10-CM

## 2012-08-10 LAB — CULTURE, ROUTINE-ABSCESS: Special Requests: NORMAL

## 2012-08-10 LAB — CBC
HCT: 25.7 % — ABNORMAL LOW (ref 36.0–46.0)
WBC: 10.7 10*3/uL — ABNORMAL HIGH (ref 4.0–10.5)

## 2012-08-10 NOTE — Progress Notes (Signed)
Patient ID: Sydney Scott, female   DOB: October 31, 1927, 79 y.o.   MRN: [id number]    Subjective: No complaints, denies n/v/pain, sore at drain site  Objective: Vital signs in last 24 hours: Temp:  [98.3 F (36.8 C)-99.7 F (37.6 C)] 98.5 F (36.9 C) (07/28 0600) Pulse Rate:  [83-98] 83 (07/28 0600) Resp:  [18] 18 (07/28 0600) BP: (138-155)/(73-87) 155/87 mmHg (07/28 0600) SpO2:  [93 %-99 %] 96 % (07/28 0600) Last BM Date: 08/07/12  Intake/Output from previous day: 07/27 0701 - 07/28 0700 In: 70 [IV Piggyback:50] Out: 13 [Drains:13] Intake/Output this shift:    PE: Abd: soft, mildly tender around drain, drain with cloudy serous output this am, +BS General: NAD  Lab Results:   Recent Labs  08/08/12 0536 08/10/12 0425  WBC 12.1* 10.7*  HGB 9.0* 8.8*  HCT 24.8* 25.7*  PLT 397 379   BMET  Recent Labs  08/08/12 0536  NA 139  K 4.1  CL 106  CO2 25  GLUCOSE 94  BUN 7  CREATININE 0.65  CALCIUM 8.3*   PT/INR No results found for this basename: LABPROT, INR,  in the last 72 hours CMP     Component Value Date/Time   NA 139 08/08/2012 0536   K 4.1 08/08/2012 0536   CL 106 08/08/2012 0536   CO2 25 08/08/2012 0536   GLUCOSE 94 08/08/2012 0536   BUN 7 08/08/2012 0536   CREATININE 0.65 08/08/2012 0536   CALCIUM 8.3* 08/08/2012 0536   PROT 5.9* 08/08/2012 0536   ALBUMIN 1.6* 08/08/2012 0536   AST 19 08/08/2012 0536   ALT 15 08/08/2012 0536   ALKPHOS 148* 08/08/2012 0536   BILITOT 1.0 08/08/2012 0536   GFRNONAA 80* 08/08/2012 0536   GFRAA >90 08/08/2012 0536   Lipase  No results found for this basename: lipase       Studies/Results: No results found.  Anti-infectives: Anti-infectives   Start     Dose/Rate Route Frequency Ordered Stop   08/04/12 0000  vancomycin (VANCOCIN) IVPB 1000 mg/200 mL premix  Status:  Discontinued     1,000 mg 200 mL/hr over 60 Minutes Intravenous Every 12 hours 08/04/12 1920 08/04/12 2037   08/03/12 2200  metroNIDAZOLE (FLAGYL) tablet 1,000  mg     1,000 mg Oral  Once 08/03/12 1159 08/03/12 2150   08/03/12 1000  metroNIDAZOLE (FLAGYL) tablet 1,000 mg     1,000 mg Oral  Once 08/03/12 0901 08/03/12 0942   08/03/12 0200  piperacillin-tazobactam (ZOSYN) IVPB 3.375 g     3.375 g 12.5 mL/hr over 240 Minutes Intravenous Every 8 hours 08/02/12 1710     08/02/12 1800  vancomycin (VANCOCIN) 500 mg in sodium chloride 0.9 % 100 mL IVPB     500 mg 100 mL/hr over 60 Minutes Intravenous Every 12 hours 08/02/12 1710 08/04/12 1931   08/02/12 1700  piperacillin-tazobactam (ZOSYN) IVPB 3.375 g     3.375 g 100 mL/hr over 30 Minutes Intravenous  Once 08/02/12 1656 08/02/12 1859       Assessment/Plan Large Liver Abscess: s/p perc drain 7/24, cultures sent, cont IV abx coverage until c and S back,  No indication for acute surgical intervention at this time, repeat CT per radiology, will sign off, please call with questions or concerns  LOS: 8 days    WHITE, ELIZABETH 08/10/2012

## 2012-08-10 NOTE — Progress Notes (Signed)
Occupational Therapy Treatment Patient Details Name: Sydney Scott MRN: [id number] DOB: 1927/08/21 Today's Date: 08/10/2012 Time: 4098-1191 OT Time Calculation (min): 23 min  OT Assessment / Plan / Recommendation           Follow Up Recommendations  Home health OT    Barriers to Discharge       Equipment Recommendations  None recommended by OT       Frequency Min 2X/week   Progress towards OT Goals Progress towards OT goals: Progressing toward goals  Plan Discharge plan needs to be updated    Precautions / Restrictions Precautions Precautions: None       ADL  Eating/Feeding: Performed;Independent Where Assessed - Eating/Feeding: Chair Grooming: Performed;Wash/dry hands;Wash/dry face Where Assessed - Grooming: Unsupported standing Toilet Transfer: Research scientist (life sciences) Method: Sit to Barista: Comfort height toilet Toileting - Clothing Manipulation and Hygiene: Supervision/safety;Performed Where Assessed - Toileting Clothing Manipulation and Hygiene: Standing Tub/Shower Transfer: Landscape architect Method: Ambulating Transfers/Ambulation Related to ADLs: Pt verbalized safety with tub transfer.  stated she would have friend there when she performed first time      OT Goals(current goals can now be found in the care plan section) Acute Rehab OT Goals Patient Stated Goal: get home and sit under my umbrella OT Goal Formulation: With patient  Visit Information  Last OT Received On: 08/10/12 Assistance Needed: +1          Cognition  Cognition Arousal/Alertness: Awake/alert Behavior During Therapy: WFL for tasks assessed/performed Overall Cognitive Status: Within Functional Limits for tasks assessed    Mobility  Transfers Transfers: Sit to Stand;Stand to Sit Sit to Stand: 5: Supervision;From chair/3-in-1;With upper extremity assist;From toilet Stand to Sit: 5: Supervision;To toilet;To  chair/3-in-1;With armrests;With upper extremity assist          End of Session OT - End of Session Activity Tolerance: Patient tolerated treatment well Patient left: in bed;with call bell/phone within reach  GO     [facility], [place] 08/10/2012, 11:33 AM

## 2012-08-10 NOTE — Progress Notes (Signed)
[facility] PROGRESS NOTE  Karlina Suares ZOX:[id number] DOB: [birth year] DOA: 08/02/2012 PCP: No PCP Per Patient  Brief narrative: This is an 79 year old female, with known history of OA, anemia, hospitalized for culture-negative UTI 07/27/12-07/28/12 and subsequently discharged on Ciprofloxacin. According patient she has been compliant with her antibiotics, and has only 2 pills left. Apparently, since then, patient has become progressively weaker, with poor appetite. She denies subjective fever or chills, denies SOB or cough. Review of EMR reveals that blood cultures of 07/27/12. Grew microaerophilic streptococci in 1:3 bottles. CXR in the ED revealed a right basilar opacity. Wcc was 19.3, temp 99.2.   Assessment/Plan: Active Problems:   Leukocytosis, unspecified   Anemia   Hypokalemia   HAP (hospital-acquired pneumonia)   Bacteremia   Liver abscess  1. HAP (hospital-acquired pneumonia): Patient presented with progressive weakness, low grade pyrexia and FTT, about 5 days post-hospitalization for UTI, despite compliance with Ciprofloxacin therapy. Wcc was elevated at 19.3, with neutrophilia. CXR confirmed RLL pneumonia. This clinical scenario is consistent with HAP. Managed with iv Vancomycin/Zosyn, as well as supportive treatment. Patient does not look toxic, however, has continued intermittent fever which may be secondary to abdominal process too. Defervesced. Patient feels clinically much better. Repeat CXR of 08/04/12, showed stable findings. Chest CT scan of 08/05/12, showed small bilateral pleural effusions with atelectasis or consolidation in the lung bases. Now day #8 antibiotics. Patient currently has no respiratory tract symptoms. Completed Abx for this indication. 2. Bacteremia (07/27/12)/Hepatic abscess-viridans streptococci (R/O Tumor): Review of EMR revealed that blood cultures of 07/27/12, grew microarophilic streptococcus in 1:3 bottles. It is not clear what role this may have had in  current presentation. Repeat blood cultures are negative. In view of recrudescence of fever, 2D Echocardiogram was done on 08/03/12, and this showed normal LV cavity size, mild concentric hypertrophy, EF of 55% to 60% and no regional wall motion abnormalities. The atrium was mildly dilated and valve were sclerotic. Dr Judyann Munson provided initial ID consultation. Managing as recommended. She discontinued Vancomycin on 08/04/12. Patient was subsequently seen by Dr Tinnie Gens hatcher, and he recommended Chest and Abdominal/Pelvic CT scan. Chest CT scan findings are described in #1 above. Abdominal/Pelvic CT on the other hand, revealed a large circumscribed loculated hypodense structure involving most of the right lobe of the liver and measuring about 9.9 x 5.9 by 8.7 cm. There is mild peripheral enhancement. General surgery and interventional radiology consulted. Patient is status post aspiration of hepatic abscess on 08/06/12 - has RUQ drain. Follow cytology results. Steadily decreasing drain output. Culture shows viridans streptococci-per ID continue Zosyn for now but may consider once daily IV ceftriaxone or Invanz at discharge. Awaiting repeat CT abdomen.  3. Hypokalemia: Potassium was 2.7 on admission. Patient is on no culprit medication, so etiology is unclear. No doubt contributory to generalized weakness. Magnesium level is normal. TSH is 1.214. Repleted as indicated. Resolved.  4. Anemia: This is mild, microcytic. Anemia panel revealed iron deficiency. FOBT is pending. Patient will benefit from outpatient colonoscopy. Stable. 5. Recent UTI: This was diagnosed on 07/27/12, and was culture-negative. Patient has completed a total of 7 days antibiotics, for this. Repeat U/A has revealed persistent pyuria and bacteriuria. Adequately addressed with current antibiotic choice. Urine culture is negative so far.  6. Trichomoniasis: This was an incidental finding on U/A. Addressed with 2g Flagyl PO on 08/03/12. HIV test  and RPR are negative.  7. Leukocytosis: secondary to # 1&2. Monitor. 8. Cholelithiasis and extensive sigmoid diverticulosis:  seen on CT abdomen. 9. Abdominal Aortic Aneurysm: seen on CT. 4.1 cm AP diameter. Outpatient followup.   Code Status: Full Code.  Family Communication: None Disposition Plan: To be determined.    Consultants:  ID  Surgery  IR   Procedures:  CXR.   2D Echocardiogram.   CT-guided drainage of hepatic abscess and placement of 12 French drain on 08/06/12  Antibiotics:  Vancomycin 08/02/12-08/04/12  Zosyn 08/02/12>>>  HPI/Subjective: Mild soreness in right upper abdomen. Denies nausea or vomiting.   Objective: Vital signs in last 24 hours: Temp:  [97.1 F (36.2 C)-99.7 F (37.6 C)] 97.1 F (36.2 C) (07/28 1357) Pulse Rate:  [83-98] 92 (07/28 1357) Resp:  [18] 18 (07/28 0600) BP: (123-155)/(75-87) 123/75 mmHg (07/28 1357) SpO2:  [93 %-96 %] 93 % (07/28 1357) Weight change:  Last BM Date: 08/07/12  Intake/Output from previous day: 07/27 0701 - 07/28 0700 In: 70 [IV Piggyback:50] Out: 13 [Drains:13] Total I/O In: 450 [P.O.:450] Out: -    Physical Exam: General: Comfortable.   CHEST: Clinically clear to auscultation, no wheezes, no crackles. No increased work of breathing. HEART: Sounds 1 and 2 heard, normal, regular, no murmurs. No JVD or pedal edema. ABDOMEN: Full, soft, no palpable organomegaly, no palpable masses, normal bowel sounds. RUQ drain-minimal purulent fluid (clearing up) in bulb. No tenderness. LOWER EXTREMITIES: No pitting edema, palpable peripheral pulses.  CENTRAL NERVOUS SYSTEM: No focal neurologic deficit on gross examination. Alert and oriented.  Lab Results:  Recent Labs  08/08/12 0536 08/10/12 0425  WBC 12.1* 10.7*  HGB 9.0* 8.8*  HCT 24.8* 25.7*  PLT 397 379    Recent Labs  08/08/12 0536  NA 139  K 4.1  CL 106  CO2 25  GLUCOSE 94  BUN 7  CREATININE 0.65  CALCIUM 8.3*   Recent Results (from the  past 240 hour(s))  CULTURE, BLOOD (ROUTINE X 2)     Status: None   Collection Time    08/02/12  4:40 PM      Result Value Range Status   Specimen Description BLOOD RIGHT HAND   Final   Special Requests NONE   Final   Culture  Setup Time 08/02/2012 20:54   Final   Culture NO GROWTH 5 DAYS   Final   Report Status 08/08/2012 FINAL   Final  CULTURE, BLOOD (ROUTINE X 2)     Status: None   Collection Time    08/02/12  5:34 PM      Result Value Range Status   Specimen Description BLOOD LEFT ANTECUBITAL   Final   Special Requests BOTTLES DRAWN AEROBIC AND ANAEROBIC 4CC   Final   Culture  Setup Time 08/02/2012 20:47   Final   Culture NO GROWTH 5 DAYS   Final   Report Status 08/08/2012 FINAL   Final  URINE CULTURE     Status: None   Collection Time    08/02/12  6:41 PM      Result Value Range Status   Specimen Description URINE, CLEAN CATCH   Final   Special Requests NONE   Final   Culture  Setup Time 08/03/2012 03:38   Final   Colony Count NO GROWTH   Final   Culture NO GROWTH   Final   Report Status 08/04/2012 FINAL   Final  CULTURE, ROUTINE-ABSCESS     Status: None   Collection Time    08/06/12  3:31 PM      Result Value Range Status  Specimen Description LIVER ABSCESS   Final   Special Requests Normal   Final   Gram Stain     Final   Value: NO WBC SEEN     NO SQUAMOUS EPITHELIAL CELLS SEEN     NO ORGANISMS SEEN   Culture RARE VIRIDANS STREPTOCOCCUS   Final   Report Status 08/10/2012 FINAL   Final  ANAEROBIC CULTURE     Status: None   Collection Time    08/06/12  3:31 PM      Result Value Range Status   Specimen Description LIVER ABSCESS   Final   Special Requests Normal   Final   Gram Stain     Final   Value: NO WBC SEEN     NO SQUAMOUS EPITHELIAL CELLS SEEN     NO ORGANISMS SEEN   Culture     Final   Value: NO ANAEROBES ISOLATED; CULTURE IN PROGRESS FOR 5 DAYS   Report Status PENDING   Incomplete     Studies/Results: No results found.  Medications: Scheduled  Meds: . heparin  5,000 Units Subcutaneous Q8H  . piperacillin-tazobactam (ZOSYN)  IV  3.375 g Intravenous Q8H  . potassium chloride  40 mEq Oral BID  . sodium chloride  3 mL Intravenous Q12H   Continuous Infusions:   PRN Meds:.sodium chloride, acetaminophen, acetaminophen, albuterol, morphine injection, ondansetron (ZOFRAN) IV, ondansetron, oxyCODONE, sodium chloride    LOS: 8 days   [facility]  [facility] Pager [phone]. If 8PM-8AM, please contact night-coverage at [email or web address], password [facility] 08/10/2012, 5:49 PM  LOS: 8 days

## 2012-08-10 NOTE — Progress Notes (Signed)
I was asked to see to see pt because she does not a PCP on record. I met with pt at the bedside. She stated she has a PCP and the name is on her Medicaid card. She stated the card is in her purse and did not want to go through it right now to find it. I asked he to call after d/c to make a follow up appt and she stated she would do that.  Algernon Huxley RN BSN  [phone]

## 2012-08-10 NOTE — Progress Notes (Signed)
General surgery attending note:  Patient interviewed and examined this afternoon. I agree with the assessment and treatment plan outlined bile is with white, PA.  The patient has become asymptomatic. Abdomen is soft and nontender. Drainage is serous.Cultures growing rare Streptococcus viridans.  She is being followed by infectious disease. She is being followed by interventional radiology.   Recommendations: Would repeat CT scan soon to insure adequacy of drainage Doubt that surgical intervention will be required. We will therefore sign off, but feel free to call us back if there are surgical issues in the future.   Angelia Mould. Derrell Lolling, M.D., [facility] General and Minimally invasive Surgery Breast and Colorectal Surgery Office:   [phone] Pager:   [phone]

## 2012-08-10 NOTE — Progress Notes (Signed)
INFECTIOUS DISEASE PROGRESS NOTE  ID: Lorene Klimas is a 79 y.o. female with  Active Problems:   Leukocytosis, unspecified   Anemia   Hypokalemia   HAP (hospital-acquired pneumonia)   Bacteremia   Liver abscess  Subjective: Without complaints. Still some soreness RUQ.   Abtx:  Anti-infectives   Start     Dose/Rate Route Frequency Ordered Stop   08/04/12 0000  vancomycin (VANCOCIN) IVPB 1000 mg/200 mL premix  Status:  Discontinued     1,000 mg 200 mL/hr over 60 Minutes Intravenous Every 12 hours 08/04/12 1920 08/04/12 2037   08/03/12 2200  metroNIDAZOLE (FLAGYL) tablet 1,000 mg     1,000 mg Oral  Once 08/03/12 1159 08/03/12 2150   08/03/12 1000  metroNIDAZOLE (FLAGYL) tablet 1,000 mg     1,000 mg Oral  Once 08/03/12 0901 08/03/12 0942   08/03/12 0200  piperacillin-tazobactam (ZOSYN) IVPB 3.375 g     3.375 g 12.5 mL/hr over 240 Minutes Intravenous Every 8 hours 08/02/12 1710     08/02/12 1800  vancomycin (VANCOCIN) 500 mg in sodium chloride 0.9 % 100 mL IVPB     500 mg 100 mL/hr over 60 Minutes Intravenous Every 12 hours 08/02/12 1710 08/04/12 1931   08/02/12 1700  piperacillin-tazobactam (ZOSYN) IVPB 3.375 g     3.375 g 100 mL/hr over 30 Minutes Intravenous  Once 08/02/12 1656 08/02/12 1859      Medications:  Scheduled: . heparin  5,000 Units Subcutaneous Q8H  . piperacillin-tazobactam (ZOSYN)  IV  3.375 g Intravenous Q8H  . potassium chloride  40 mEq Oral BID  . sodium chloride  3 mL Intravenous Q12H    Objective: Vital signs in last 24 hours: Temp:  [97.1 F (36.2 C)-99.7 F (37.6 C)] 97.1 F (36.2 C) (07/28 1357) Pulse Rate:  [83-98] 92 (07/28 1357) Resp:  [18] 18 (07/28 0600) BP: (123-155)/(75-87) 123/75 mmHg (07/28 1357) SpO2:  [93 %-96 %] 93 % (07/28 1357)   General appearance: alert, cooperative and no distress Resp: clear to auscultation bilaterally Cardio: regular rate and rhythm GI: normal findings: bowel sounds normal, soft, non-tender and  darin in place  Lab Results  Recent Labs  08/08/12 0536 08/10/12 0425  WBC 12.1* 10.7*  HGB 9.0* 8.8*  HCT 24.8* 25.7*  NA 139  --   K 4.1  --   CL 106  --   CO2 25  --   BUN 7  --   CREATININE 0.65  --    Liver Panel  Recent Labs  08/08/12 0536  PROT 5.9*  ALBUMIN 1.6*  AST 19  ALT 15  ALKPHOS 148*  BILITOT 1.0   Sedimentation Rate No results found for this basename: ESRSEDRATE,  in the last 72 hours C-Reactive Protein No results found for this basename: CRP,  in the last 72 hours  Microbiology: Recent Results (from the past 240 hour(s))  CULTURE, BLOOD (ROUTINE X 2)     Status: None   Collection Time    08/02/12  4:40 PM      Result Value Range Status   Specimen Description BLOOD RIGHT HAND   Final   Special Requests NONE   Final   Culture  Setup Time 08/02/2012 20:54   Final   Culture NO GROWTH 5 DAYS   Final   Report Status 08/08/2012 FINAL   Final  CULTURE, BLOOD (ROUTINE X 2)     Status: None   Collection Time    08/02/12  5:34 PM  Result Value Range Status   Specimen Description BLOOD LEFT ANTECUBITAL   Final   Special Requests BOTTLES DRAWN AEROBIC AND ANAEROBIC 4CC   Final   Culture  Setup Time 08/02/2012 20:47   Final   Culture NO GROWTH 5 DAYS   Final   Report Status 08/08/2012 FINAL   Final  URINE CULTURE     Status: None   Collection Time    08/02/12  6:41 PM      Result Value Range Status   Specimen Description URINE, CLEAN CATCH   Final   Special Requests NONE   Final   Culture  Setup Time 08/03/2012 03:38   Final   Colony Count NO GROWTH   Final   Culture NO GROWTH   Final   Report Status 08/04/2012 FINAL   Final  CULTURE, ROUTINE-ABSCESS     Status: None   Collection Time    08/06/12  3:31 PM      Result Value Range Status   Specimen Description LIVER ABSCESS   Final   Special Requests Normal   Final   Gram Stain     Final   Value: NO WBC SEEN     NO SQUAMOUS EPITHELIAL CELLS SEEN     NO ORGANISMS SEEN   Culture RARE  VIRIDANS STREPTOCOCCUS   Final   Report Status 08/10/2012 FINAL   Final  ANAEROBIC CULTURE     Status: None   Collection Time    08/06/12  3:31 PM      Result Value Range Status   Specimen Description LIVER ABSCESS   Final   Special Requests Normal   Final   Gram Stain     Final   Value: NO WBC SEEN     NO SQUAMOUS EPITHELIAL CELLS SEEN     NO ORGANISMS SEEN   Culture     Final   Value: NO ANAEROBES ISOLATED; CULTURE IN PROGRESS FOR 5 DAYS   Report Status PENDING   Incomplete    Studies/Results: No results found.   Assessment/Plan: Liver Abscess  Viridans strep  Awaiting repeat CT scan Drain out 13 mL last 24h No change in anbx for now.  Could consider home with once daily IV rx (ceftriaxone or invanz).   Total days of antibiotics: 9 (zosyn)         [name] Infectious Diseases (pager) [phone] [email or web address] 08/10/2012, 4:00 PM  LOS: 8 days

## 2012-08-10 NOTE — Progress Notes (Signed)
Subjective: Pt feeling ok. Eating pretty good. Reports mild soreness at drain site.  Objective: Physical Exam: BP 155/87  Pulse 83  Temp(Src) 98.5 F (36.9 C) (Oral)  Resp 18  Ht 5\' 4"  (1.626 m)  Wt 153 lb (69.4 kg)  BMI 26.25 kg/m2  SpO2 96% Drain intact, site clean. Output thin serosanguinous, only 13cc recorded yesterday, but ~10cc in bulb now this am.    Labs: CBC  Recent Labs  08/08/12 0536 08/10/12 0425  WBC 12.1* 10.7*  HGB 9.0* 8.8*  HCT 24.8* 25.7*  PLT 397 379   BMET  Recent Labs  08/08/12 0536  NA 139  K 4.1  CL 106  CO2 25  GLUCOSE 94  BUN 7  CREATININE 0.65  CALCIUM 8.3*   LFT  Recent Labs  08/08/12 0536  PROT 5.9*  ALBUMIN 1.6*  AST 19  ALT 15  ALKPHOS 148*  BILITOT 1.0    Assessment/Plan: S/p rt lobe hepatic abscess drainage 7/24 Cx still pending. WBC nearing normal, afebrile and clinically looking better. Output steadily decreasing. If output remains low (<20cc/day), would repeat CT in next 1-2 days for re-evaluation of abscess/adequacy of drainage.    LOS: 8 days    Brayton El PA-C 08/10/2012 10:06 AM

## 2012-08-10 NOTE — Progress Notes (Signed)
Patient refusing to wear SCD's states she does not need these

## 2012-08-11 ENCOUNTER — Other Ambulatory Visit: Payer: Self-pay | Admitting: Radiology

## 2012-08-11 ENCOUNTER — Inpatient Hospital Stay (HOSPITAL_COMMUNITY): Payer: Medicare Other

## 2012-08-11 DIAGNOSIS — K750 Abscess of liver: Secondary | ICD-10-CM

## 2012-08-11 LAB — ANAEROBIC CULTURE: Gram Stain: NONE SEEN

## 2012-08-11 MED ORDER — POTASSIUM CHLORIDE CRYS ER 20 MEQ PO TBCR: 40.0000 meq | EXTENDED_RELEASE_TABLET | Freq: Every day | ORAL | Status: DC

## 2012-08-11 MED ORDER — DEXTROSE 5 % IV SOLN
1.0000 g | Freq: Every day | INTRAVENOUS | Status: DC
  Administered 2012-08-11: 1 g via INTRAVENOUS
  Filled 2012-08-11: qty 10

## 2012-08-11 MED ORDER — DEXTROSE 5 % IV SOLN: 1.0000 g | Freq: Every day | INTRAVENOUS | Status: DC

## 2012-08-11 MED ORDER — ACETAMINOPHEN 325 MG PO TABS: 650.0000 mg | ORAL_TABLET | Freq: Four times a day (QID) | ORAL | Status: DC | PRN

## 2012-08-11 NOTE — Progress Notes (Signed)
Pt has chosen [facility] to provide [facility] services. Referral made.  Algernon Huxley RN BSN  [phone]

## 2012-08-11 NOTE — Progress Notes (Signed)
ANTIBIOTIC CONSULT NOTE - FOLLOW UP  Pharmacy Consult for Zosyn  Indication: HAP/bacteremia/liver abscess   Allergies  Allergen Reactions  . Aspirin Nausea And Vomiting    Patient Measurements: Height: 5\' 4"  (162.6 cm) Weight: 153 lb (69.4 kg) IBW/kg (Calculated) : 54.7   Vital Signs: Temp: 98.8 F (37.1 C) (07/29 0616) Temp src: Oral (07/29 0616) BP: 159/60 mmHg (07/29 0616) Pulse Rate: 88 (07/29 0616) Intake/Output from previous day: 07/28 0701 - 07/29 0700 In: 1297.2 [P.O.:690; I.V.:327.2; IV Piggyback:250] Out: 23 [Urine:2; Drains:20; Stool:1] Intake/Output from this shift:    Labs:  Recent Labs  08/10/12 0425  WBC 10.7*  HGB 8.8*  PLT 379   Estimated Creatinine Clearance: 51 ml/min (by C-G formula based on Cr of 0.65). No results found for this basename: VANCOTROUGH, Leodis Binet, VANCORANDOM, GENTTROUGH, GENTPEAK, GENTRANDOM, TOBRATROUGH, TOBRAPEAK, TOBRARND, AMIKACINPEAK, AMIKACINTROU, AMIKACIN,  in the last 72 hours   Microbiology: Recent Results (from the past 720 hour(s))  CULTURE, BLOOD (ROUTINE X 2)     Status: None   Collection Time    07/27/12  9:50 AM      Result Value Range Status   Specimen Description BLOOD RT HAND   Final   Special Requests Normal BOTTLES DRAWN AEROBIC AND ANAEROBIC  4CC   Final   Culture  Setup Time 07/27/2012 15:07   Final   Culture     Final   Value: MICROAEROPHILIC STREPTOCOCCI     Note: Standardized susceptibility testing for this organism is not available.     Note: Gram Stain Report Called to,Read Back By and Verified With: COURTNEY K @1430  07/28/12 BY KRAWS   Report Status 07/30/2012 FINAL   Final  CULTURE, BLOOD (ROUTINE X 2)     Status: None   Collection Time    07/27/12 10:10 AM      Result Value Range Status   Specimen Description BLOOD LEFT HAND   Final   Special Requests Normal BOTTLES DRAWN AEROBIC ONLY  3CC   Final   Culture  Setup Time 07/27/2012 15:07   Final   Culture NO GROWTH 5 DAYS   Final   Report  Status 08/02/2012 FINAL   Final  URINE CULTURE     Status: None   Collection Time    07/27/12 11:25 AM      Result Value Range Status   Specimen Description URINE, CATHETERIZED   Final   Special Requests Normal   Final   Culture  Setup Time 07/27/2012 15:30   Final   Colony Count NO GROWTH   Final   Culture NO GROWTH   Final   Report Status 07/28/2012 FINAL   Final  CULTURE, BLOOD (SINGLE)     Status: None   Collection Time    07/27/12  1:00 PM      Result Value Range Status   Specimen Description BLOOD R HAND   Final   Special Requests BOTTLES DRAWN AEROBIC AND ANAEROBIC 4.5 CC   Final   Culture  Setup Time 07/27/2012 15:07   Final   Culture NO GROWTH 5 DAYS   Final   Report Status 08/02/2012 FINAL   Final  URINE CULTURE     Status: None   Collection Time    07/27/12  7:37 PM      Result Value Range Status   Specimen Description URINE, RANDOM   Final   Special Requests NONE   Final   Culture  Setup Time 07/28/2012 00:42   Final  Colony Count NO GROWTH   Final   Culture NO GROWTH   Final   Report Status 07/28/2012 FINAL   Final  CULTURE, BLOOD (ROUTINE X 2)     Status: None   Collection Time    08/02/12  4:40 PM      Result Value Range Status   Specimen Description BLOOD RIGHT HAND   Final   Special Requests NONE   Final   Culture  Setup Time 08/02/2012 20:54   Final   Culture NO GROWTH 5 DAYS   Final   Report Status 08/08/2012 FINAL   Final  CULTURE, BLOOD (ROUTINE X 2)     Status: None   Collection Time    08/02/12  5:34 PM      Result Value Range Status   Specimen Description BLOOD LEFT ANTECUBITAL   Final   Special Requests BOTTLES DRAWN AEROBIC AND ANAEROBIC 4CC   Final   Culture  Setup Time 08/02/2012 20:47   Final   Culture NO GROWTH 5 DAYS   Final   Report Status 08/08/2012 FINAL   Final  URINE CULTURE     Status: None   Collection Time    08/02/12  6:41 PM      Result Value Range Status   Specimen Description URINE, CLEAN CATCH   Final   Special Requests  NONE   Final   Culture  Setup Time 08/03/2012 03:38   Final   Colony Count NO GROWTH   Final   Culture NO GROWTH   Final   Report Status 08/04/2012 FINAL   Final  CULTURE, ROUTINE-ABSCESS     Status: None   Collection Time    08/06/12  3:31 PM      Result Value Range Status   Specimen Description LIVER ABSCESS   Final   Special Requests Normal   Final   Gram Stain     Final   Value: NO WBC SEEN     NO SQUAMOUS EPITHELIAL CELLS SEEN     NO ORGANISMS SEEN   Culture RARE VIRIDANS STREPTOCOCCUS   Final   Report Status 08/10/2012 FINAL   Final  ANAEROBIC CULTURE     Status: None   Collection Time    08/06/12  3:31 PM      Result Value Range Status   Specimen Description LIVER ABSCESS   Final   Special Requests Normal   Final   Gram Stain     Final   Value: NO WBC SEEN     NO SQUAMOUS EPITHELIAL CELLS SEEN     NO ORGANISMS SEEN   Culture     Final   Value: NO ANAEROBES ISOLATED; CULTURE IN PROGRESS FOR 5 DAYS   Report Status PENDING   Incomplete    Anti-infectives   Start     Dose/Rate Route Frequency Ordered Stop   08/04/12 0000  vancomycin (VANCOCIN) IVPB 1000 mg/200 mL premix  Status:  Discontinued     1,000 mg 200 mL/hr over 60 Minutes Intravenous Every 12 hours 08/04/12 1920 08/04/12 2037   08/03/12 2200  metroNIDAZOLE (FLAGYL) tablet 1,000 mg     1,000 mg Oral  Once 08/03/12 1159 08/03/12 2150   08/03/12 1000  metroNIDAZOLE (FLAGYL) tablet 1,000 mg     1,000 mg Oral  Once 08/03/12 0901 08/03/12 0942   08/03/12 0200  piperacillin-tazobactam (ZOSYN) IVPB 3.375 g     3.375 g 12.5 mL/hr over 240 Minutes Intravenous Every 8 hours 08/02/12 1710  08/02/12 1800  vancomycin (VANCOCIN) 500 mg in sodium chloride 0.9 % 100 mL IVPB     500 mg 100 mL/hr over 60 Minutes Intravenous Every 12 hours 08/02/12 1710 08/04/12 1931   08/02/12 1700  piperacillin-tazobactam (ZOSYN) IVPB 3.375 g     3.375 g 100 mL/hr over 30 Minutes Intravenous  Once 08/02/12 1656 08/02/12 1859       Assessment: 79 yo F on D#10 Zosyn 3.375 grams IV q8h, continuing now for viridan strep liver abscess.  ID following.  Plan for repeat CT scan and continue abx, considering discharging home with once daily rocephin or invanz.  Renal function remains stable. Will continue to follow.   Goal of Therapy:  Zosyn per renal function   Plan:  1.) Continue Zosyn 3.375 grams IV q8h 2.) f/u LOT/abx plan with ID  Ruchama Kubicek, Loma Messing PharmD Pager #: [phone] 7:07 AM 08/11/2012

## 2012-08-11 NOTE — Progress Notes (Signed)
Per pts family, her PCP is Dr Marlou Starks.  Algernon Huxley RN BSN

## 2012-08-11 NOTE — Discharge Summary (Signed)
Physician Discharge Summary  Sydney Scott ZOX:[id number] DOB: [birth year] DOA: 08/02/2012  PCP: Marlou Starks, MD  Admit date: 08/02/2012 Discharge date: 08/11/2012  Time spent: Greater than 30 minutes  Recommendations for Outpatient Follow-up:  1. Dr. [name], Infectious Disease 08/26/2012 at 10:45 AM 2. Radiology on 08/17/12 for repeat CT Abdomen and follow up with IR regarding removal of drain. 3. Dr. Marlou Starks, PCP in one week with repeat labs (CBC & BMP) 4. Home health RN for IV antibiotic management. 5. IV Rocephin 1 g daily. Duration of treatment to be determined by Dr. [name] infectious disease M.D. Continue this dose until followup with him on 08/26/12.  6. Right Upper Quadrant drain management: Flush drain with 10 ML sterile saline daily, keep clean dry dressing around drain site and change when necessary, measure and record output daily. 7. Recommend follow up of CXR in couple of weeks to ensure resolution of PNA findings. 8. Recommend OP GI consultation for Anemia evaluation/colonoscopy. 9. OP follow up of AAA.  Discharge Diagnoses:  Active Problems:   Leukocytosis, unspecified   Anemia   Hypokalemia   HAP (hospital-acquired pneumonia)   Bacteremia   Liver abscess   Discharge Condition: Improved & Stable  Diet recommendation: Heart healthy diet.   Filed Weights   08/02/12 2237  Weight: 69.4 kg (153 lb)    History of present illness:  This is an 79 year old female, with known history of OA, anemia, hospitalized for culture-negative UTI 07/27/12-07/28/12 and subsequently discharged on Ciprofloxacin. According to patient she has been compliant with her antibiotics, and has only 2 pills left. Apparently, since then, patient has become progressively weaker, with poor appetite. She denies subjective fever or chills, denies SOB or cough. Review of EMR reveals that blood cultures of 07/27/12 grew microaerophilic streptococci in 1:3 bottles. CXR in the ED revealed a  right basilar opacity. Wcc was 19.3, temp 99.2.   Hospital Course:  1. Hospital Acquired PNA: She had low grade fever, weakness, WBC 19.3 and CXR suggestive of RLL pneumonia. She was recently hospitalized. She was initially treated with Vancomycin & Zosyn and once Blood cultures were negative, Vancomycin was discontinued. She completed treatment for this. Recommend follow up of CXR in couple of weeks to ensure resolution of PNA findings. 2. Bacteremia: Review of EMR revealed that blood cultures of 07/27/12, grew microarophilic streptococcus in 1:3 bottles.She was treated with above antibiotic regimen. Repeat blood cultures were negative. 2 D echo results are as below. Thought to be contaminant. 3. Hepatic Abscess/Viridans Strep: Patient had recurrence of fever despite being on antibiotics. As per ID recommendations, CT of chest, abdomen & Pelvis were done which revealed findings of possible liver abscess/necrotic mass. General Surgery was consulted who recommended IR consultation. IR performed CT guided drainage of hepatic abscess and placed a 12 Fr drain and attached to suction bulb. IR, Surgery and ID continued to follow patient. The drainage has been decreasing. Culture revealed Viridans Streptococci. ID recommended DC on Rocephin until OP follow up with then in 1-2 weeks. IR plans to follow up with repeat CT abdomen in a week and possible drain removal. Patient has improved with no further fever, decreased leukocytosis and mild soreness at drain site. AFP was negative and there were no malignant cells on cytopathology. 4. Hypokalemia: corrected. 5. Anemia: Microcytic. Anemia panel revealed iron deficiency. OP follow & work up as deemed necessary. 6. Recent UTI (Culture negative): This was diagnosed on 07/27/12, and was culture-negative . Patient had completed a  total of 7 days antibiotics, for this. Repeat U/A has revealed persistent pyuria and bacteriuria. Adequately addressed with current antibiotic  choice. Final Urine culture again negative..  7. Trichomoniasis: This was an incidental finding on U/A. Addressed with 1g Flagyl PO today. HIV test and RPR are negative.  8. Leukocytosis: secondary to # 1&2. Resolved. 9. Cholelithiasis and extensive sigmoid diverticulosis: seen on CT abdomen.  10. Abdominal Aortic Aneurysm: seen on CT. 4.1 cm AP diameter. Outpatient followup.  Procedures: 1. CT guided drainage of hepatic abscess 08/06/2012. Findings: Aspiration of purulent fluid. Sample sent for culture studies. 12 Fr drain placed and attached to suction bulb. Will follow output. 2. PICC line placed right arm.  Consultations:  ID  IR  General Surgery  Discharge Exam:  Complaints: Mild intermittent soreness RUQ. No nausea or vomiting. Had BM. Tolerating diet well. Anxious to go home.  Filed Vitals:   08/10/12 1357 08/10/12 2135 08/11/12 0616 08/11/12 1400  BP: 123/75 155/84 159/60 150/77  Pulse: 92 94 88 88  Temp: 97.1 F (36.2 C) 98.5 F (36.9 C) 98.8 F (37.1 C) 97.9 F (36.6 C)  TempSrc: Oral Oral Oral Oral  Resp:  20 18 16   Height:      Weight:      SpO2: 93% 96% 94% 95%    General: Comfortable.  CHEST: Clinically clear to auscultation, no wheezes, no crackles. No increased work of breathing.  HEART: Sounds 1 and 2 heard, normal, regular, no murmurs. No JVD or pedal edema.  ABDOMEN: Full, soft, no palpable organomegaly, no palpable masses, normal bowel sounds. RUQ drain-minimal purulent fluid (clearing up) in bulb. No tenderness.  LOWER EXTREMITIES: No pitting edema, palpable peripheral pulses.  CENTRAL NERVOUS SYSTEM: No focal neurologic deficit on gross examination. Alert and oriented.   Discharge Instructions      Discharge Orders   Future Appointments Provider Department Dept Phone   08/26/2012 10:45 AM Ginnie Smart, MD [facility] for Infectious Disease [phone]   Future Orders Complete By Expires     Call MD for:  extreme fatigue   As directed     Call MD for:  persistant dizziness or light-headedness  As directed     Call MD for:  persistant nausea and vomiting  As directed     Call MD for:  redness, tenderness, or signs of infection (pain, swelling, redness, odor or green/yellow discharge around incision site)  As directed     Call MD for:  severe uncontrolled pain  As directed     Call MD for:  temperature >100.4  As directed     Diet - low sodium heart healthy  As directed     Increase activity slowly  As directed         Medication List    STOP taking these medications       ciprofloxacin 500 MG tablet  Commonly known as:  CIPRO      TAKE these medications       acetaminophen 325 MG tablet  Commonly known as:  TYLENOL  Take 2 tablets (650 mg total) by mouth every 6 (six) hours as needed for pain or fever.     dextrose 5 % SOLN 50 mL with cefTRIAXone 1 G SOLR 1 g  Inject 1 g into the vein daily. Duration of treatment to be determined by Dr. [name], Infectious Disease MD. Continue until OP follow up with him in 1-2 weeks.     potassium chloride SA  20 MEQ tablet  Commonly known as:  K-DUR,KLOR-CON  Take 2 tablets (40 mEq total) by mouth daily.       Follow-up Information   Follow up with [facility] In 1 week. (Order is in for CT scan to be done 8/4. After discharge, need to call [phone] to confirm date/time)    Contact information:   [address] [phone]       The results of significant diagnostics from this hospitalization (including imaging, microbiology, ancillary and laboratory) are listed below for reference.    Significant Diagnostic Studies: Dg Chest 2 View  08/04/2012   *RADIOLOGY REPORT*  Clinical Data: Pneumonia  CHEST - 2 VIEW  Comparison: 07/14 and 08/02/2012  Findings: Heart mediastinal contours are unchanged with aortic ectasia and normal heart size. Elevation of the right hemidiaphragm is unchanged.  Linear density at the right lung base and less  prominently at the left lung base is most compatible with subsegmental atelectasis. More focal density posteriorly on the right is seen on the lateral view suspicious for an area of focal bronchopneumonia.  These findings are unchanged in comparison with the prior exam.  IMPRESSION: Unchanged cardiopulmonary appearance with bibasilar subsegmental atelectasis and probable posterior right lower lobe pneumonia.   Original Report Authenticated By: Rhodia Albright, M.D.   Dg Chest 2 View  08/02/2012   *RADIOLOGY REPORT*  Clinical Data: Weakness  CHEST - 2 VIEW  Comparison: 07/27/2012  Findings: Small right pleural effusion.  Associated right basilar opacity, atelectasis versus pneumonia.  Stable mild elevation of the right hemidiaphragm.  The heart is normal in size.  Visualized osseous structures are within normal limits.  IMPRESSION: Small right pleural effusion.  Associated right basilar opacity, atelectasis versus pneumonia.   Original Report Authenticated By: Charline Bills, M.D.   Dg Chest 2 View  07/27/2012   *RADIOLOGY REPORT*  Clinical Data: Fever, weakness, loss of appetite  CHEST - 2 VIEW  Comparison: None.  Findings: Cardiomediastinal silhouette is unremarkable.  Mild elevation of the right hemidiaphragm with right basilar atelectasis.  No segmental infiltrate or pulmonary edema.  IMPRESSION: Mild elevation of the right hemidiaphragm with right basilar atelectasis.  No segmental infiltrate or pulmonary edema.   Original Report Authenticated By: Natasha Mead, M.D.   Ct Chest W Contrast  08/05/2012   *RADIOLOGY REPORT*  Clinical Data:  Fever of unknown origin.  Elevated white cell count.  Pneumonia.  Right lower lobe infiltrates.  CT CHEST, ABDOMEN AND PELVIS WITH CONTRAST  Technique:  Multidetector CT imaging of the chest, abdomen and pelvis was performed following the standard protocol during bolus administration of intravenous contrast.  Contrast: OMNIPAQUE IOHEXOL 300 MG/ML  SOLN, 50mL  OMNIPAQUE IOHEXOL 300 MG/ML  SOLN  Comparison:   None.  CT CHEST  Findings:  Normal heart size.  Coronary artery calcifications. Normal caliber thoracic aorta with calcification.  Great vessels are unremarkable.  No evidence of aortic dissection.  No significant lymphadenopathy in the chest.  The esophagus is decompressed.  Small bilateral pleural effusions with atelectasis or consolidation in the lung bases.  Visualization of lung fields is limited due to respiratory motion artifact but no apparent pneumothorax or interstitial changes are identified.  Airways appear patent.  Emphysematous changes and scarring in the apices. Degenerative changes in the thoracic spine.  No destructive bone lesions appreciated.  IMPRESSION: Small bilateral pleural effusions with atelectasis or consolidation in the lung bases.  CT ABDOMEN AND PELVIS  Findings:  There is a large circumscribed  loculated hypodense structure involving most of the right lobe of the liver and measuring about 9.9 x 5.9 by 8.7 cm.  There is mild peripheral enhancement.  Given the history of fevers, this may represent hepatic abscess.  Necrotic mass or metastasis is not excluded.  No bile duct dilatation.  The gallbladder is contracted with mildly thickened wall.  Gallstones are present.  No pericholecystic infiltration.  The pancreas, spleen, adrenal glands, inferior vena cava, and retroperitoneal lymph nodes are unremarkable.  Cysts in the lower pole of the right kidney, largest measuring 3.2 cm diameter.  No evidence of solid mass or hydronephrosis in either kidney.  There is an infrarenal abdominal aortic aneurysm extending to the bifurcation with involvement of the right iliac artery.  The aneurysm measures up to about 4 cm maximal AP diameter by 4.1 cm transverse diameter.  Right iliac artery measures 1.9 cm diameter. There is extensive aortoiliac calcification with mild mural thrombus. No abnormal retroperitoneal fluid collection to suggest rupture.  The  stomach is decompressed.  Small bowel are decompressed.  Stool filled colon without distension.  No free air or free fluid in the abdomen.  Abdominal wall musculature appears intact although atrophic.  Pelvis:  Small amount of free fluid in the pelvis.  No loculated fluid collections are identified.  Calcification in the uterus suggesting fibroids.  No abnormal adnexal masses.  The bladder demonstrates diffusely thick walled with diverticula arising from the right lateral wall and the anterior wall of the bladder. Changes suggest cystitis versus hypertrophy due to outlet obstruction.  The appendix is normal.  There is diverticulosis of the sigmoid colon.  No inflammatory changes to suggest diverticulitis.  There are giant appearing stool filled diverticula present.  These measure up to about 3.2 cm diameter.  No significant pelvic lymphadenopathy.  Focal subcutaneous gas collection in the anterior left abdominal wall likely representing injection site.  Degenerative changes in the lumbar spine with normal alignment.  No destructive bone lesions are appreciated.  IMPRESSION: Lobulated hypodense lesion in the right lobe of the liver likely represents a hepatic abscess versus necrotic tumor.  No evidence of appendicitis or diverticulitis.  The gallbladder is contracted with stones.  Cholecystitis is not excluded.  Extensive diverticulosis of the sigmoid colon.  Abdominal aortic aneurysm measuring up to 4.1 cm maximal AP diameter. Bladder wall thickening with bladder diverticula.   Original Report Authenticated By: Burman Nieves, M.D.   Ct Abdomen Pelvis W Contrast  08/05/2012   *RADIOLOGY REPORT*  Clinical Data:  Fever of unknown origin.  Elevated white cell count.  Pneumonia.  Right lower lobe infiltrates.  CT CHEST, ABDOMEN AND PELVIS WITH CONTRAST  Technique:  Multidetector CT imaging of the chest, abdomen and pelvis was performed following the standard protocol during bolus administration of intravenous  contrast.  Contrast: OMNIPAQUE IOHEXOL 300 MG/ML  SOLN, 50mL OMNIPAQUE IOHEXOL 300 MG/ML  SOLN  Comparison:   None.  CT CHEST  Findings:  Normal heart size.  Coronary artery calcifications. Normal caliber thoracic aorta with calcification.  Great vessels are unremarkable.  No evidence of aortic dissection.  No significant lymphadenopathy in the chest.  The esophagus is decompressed.  Small bilateral pleural effusions with atelectasis or consolidation in the lung bases.  Visualization of lung fields is limited due to respiratory motion artifact but no apparent pneumothorax or interstitial changes are identified.  Airways appear patent.  Emphysematous changes and scarring in the apices. Degenerative changes in the thoracic spine.  No destructive bone lesions  appreciated.  IMPRESSION: Small bilateral pleural effusions with atelectasis or consolidation in the lung bases.  CT ABDOMEN AND PELVIS  Findings:  There is a large circumscribed loculated hypodense structure involving most of the right lobe of the liver and measuring about 9.9 x 5.9 by 8.7 cm.  There is mild peripheral enhancement.  Given the history of fevers, this may represent hepatic abscess.  Necrotic mass or metastasis is not excluded.  No bile duct dilatation.  The gallbladder is contracted with mildly thickened wall.  Gallstones are present.  No pericholecystic infiltration.  The pancreas, spleen, adrenal glands, inferior vena cava, and retroperitoneal lymph nodes are unremarkable.  Cysts in the lower pole of the right kidney, largest measuring 3.2 cm diameter.  No evidence of solid mass or hydronephrosis in either kidney.  There is an infrarenal abdominal aortic aneurysm extending to the bifurcation with involvement of the right iliac artery.  The aneurysm measures up to about 4 cm maximal AP diameter by 4.1 cm transverse diameter.  Right iliac artery measures 1.9 cm diameter. There is extensive aortoiliac calcification with mild mural thrombus. No  abnormal retroperitoneal fluid collection to suggest rupture.  The stomach is decompressed.  Small bowel are decompressed.  Stool filled colon without distension.  No free air or free fluid in the abdomen.  Abdominal wall musculature appears intact although atrophic.  Pelvis:  Small amount of free fluid in the pelvis.  No loculated fluid collections are identified.  Calcification in the uterus suggesting fibroids.  No abnormal adnexal masses.  The bladder demonstrates diffusely thick walled with diverticula arising from the right lateral wall and the anterior wall of the bladder. Changes suggest cystitis versus hypertrophy due to outlet obstruction.  The appendix is normal.  There is diverticulosis of the sigmoid colon.  No inflammatory changes to suggest diverticulitis.  There are giant appearing stool filled diverticula present.  These measure up to about 3.2 cm diameter.  No significant pelvic lymphadenopathy.  Focal subcutaneous gas collection in the anterior left abdominal wall likely representing injection site.  Degenerative changes in the lumbar spine with normal alignment.  No destructive bone lesions are appreciated.  IMPRESSION: Lobulated hypodense lesion in the right lobe of the liver likely represents a hepatic abscess versus necrotic tumor.  No evidence of appendicitis or diverticulitis.  The gallbladder is contracted with stones.  Cholecystitis is not excluded.  Extensive diverticulosis of the sigmoid colon.  Abdominal aortic aneurysm measuring up to 4.1 cm maximal AP diameter. Bladder wall thickening with bladder diverticula.   Original Report Authenticated By: Burman Nieves, M.D.   Ir Fluoro Guide Cv Line Right  08/11/2012   *RADIOLOGY REPORT*  Clinical Data: Patient with hepatic abscess requiring home IV antibiotics.  Powered PICC LINE PLACEMENT WITH ULTRASOUND AND FLUOROSCOPIC GUIDANCE  Fluoroscopy Time:  3 minutes, 30 seconds  The right arm was prepped with chlorhexidine, draped in the usual  sterile fashion using maximum barrier technique (cap and mask, sterile gown, sterile gloves, large sterile sheet, hand hygiene and cutaneous antisepsis) and infiltrated locally with 1% Lidocaine.  Ultrasound demonstrated patency of the right brachial vein, and this was documented with an image.  Under real-time ultrasound guidance, this vein was accessed with a 21 gauge micropuncture needle and image documentation was performed.  The needle was exchanged over a guidewire for a peel-away sheath through which a five [place] dual lumen PICC trimmed to 44 cm was advanced, positioned with its tip at the lower SVC/right atrial junction. Fluoroscopy during the  procedure and fluoro spot radiograph confirms appropriate catheter position.  The catheter was flushed, secured to the skin with Prolene sutures, and covered with a sterile dressing.  Complications:  None  IMPRESSION: Successful right arm poweredPICC line placement with ultrasound and fluoroscopic guidance.  The catheter is ready for use.  Read by Brayton El PA-C   Original Report Authenticated By: Jolaine Click, M.D.   Ir US Guide Vasc Access Right  08/11/2012   *RADIOLOGY REPORT*  Clinical Data: Patient with hepatic abscess requiring home IV antibiotics.  Powered PICC LINE PLACEMENT WITH ULTRASOUND AND FLUOROSCOPIC GUIDANCE  Fluoroscopy Time:  3 minutes, 30 seconds  The right arm was prepped with chlorhexidine, draped in the usual sterile fashion using maximum barrier technique (cap and mask, sterile gown, sterile gloves, large sterile sheet, hand hygiene and cutaneous antisepsis) and infiltrated locally with 1% Lidocaine.  Ultrasound demonstrated patency of the right brachial vein, and this was documented with an image.  Under real-time ultrasound guidance, this vein was accessed with a 21 gauge micropuncture needle and image documentation was performed.  The needle was exchanged over a guidewire for a peel-away sheath through which a five [place] dual lumen  PICC trimmed to 44 cm was advanced, positioned with its tip at the lower SVC/right atrial junction. Fluoroscopy during the procedure and fluoro spot radiograph confirms appropriate catheter position.  The catheter was flushed, secured to the skin with Prolene sutures, and covered with a sterile dressing.  Complications:  None  IMPRESSION: Successful right arm poweredPICC line placement with ultrasound and fluoroscopic guidance.  The catheter is ready for use.  Read by Brayton El PA-C   Original Report Authenticated By: Jolaine Click, M.D.   Ct Image Guided Drainage By Percutaneous Catheter  08/06/2012   *RADIOLOGY REPORT*  Clinical Data: Hepatic abscess with fever and leukocytosis.  CT GUIDED DRAINAGE OF HEPATIC ABSCESS  Sedation:  1.5 mg IV Versed;  100 mcg IV Fentanyl  Total Moderate Sedation Time: 14 minutes.  Procedure:  The procedure, risks, benefits, and alternatives were explained to the patient.  Questions regarding the procedure were encouraged and answered. The patient understands and consents to the procedure.  The right abdominal wall was prepped with Betadine in a sterile fashion, and a sterile drape was applied covering the operative field.  A sterile gown and sterile gloves were used for the procedure. Local anesthesia was provided with 1% Lidocaine.  The CT was performed in a supine and slightly oblique position with the right side rolled up.  After localizing a large hepatic abscess, an 18 gauge needle was advanced into the collection. Aspiration of fluid sample was performed with the sample sent for culture analysis.  A guidewire was advanced into the collection.  The tract was dilated and a 12-French drainage catheter placed in the collection.  The catheter was connected to a suction bulb.  It was secured at the skin with a Prolene retention suture and Stat- Lock device.  Complications: None  Findings: Aspiration revealed grossly purulent fluid.  The percutaneous drain was positioned in the  dominant liquefied pocket posteriorly.  There is excellent drainage of fluid from the drain after placement.  IMPRESSION: CT guided drainage of hepatic abscess.  A grossly purulent fluid sample was sent for culture analysis.  A 12-French drain was placed in the abscess.  This was connected to a suction bulb.  Output will be followed.   Original Report Authenticated By: Irish Lack, M.D.    Microbiology: Recent Results (from the  past 240 hour(s))  CULTURE, BLOOD (ROUTINE X 2)     Status: None   Collection Time    08/02/12  4:40 PM      Result Value Range Status   Specimen Description BLOOD RIGHT HAND   Final   Special Requests NONE   Final   Culture  Setup Time 08/02/2012 20:54   Final   Culture NO GROWTH 5 DAYS   Final   Report Status 08/08/2012 FINAL   Final  CULTURE, BLOOD (ROUTINE X 2)     Status: None   Collection Time    08/02/12  5:34 PM      Result Value Range Status   Specimen Description BLOOD LEFT ANTECUBITAL   Final   Special Requests BOTTLES DRAWN AEROBIC AND ANAEROBIC 4CC   Final   Culture  Setup Time 08/02/2012 20:47   Final   Culture NO GROWTH 5 DAYS   Final   Report Status 08/08/2012 FINAL   Final  URINE CULTURE     Status: None   Collection Time    08/02/12  6:41 PM      Result Value Range Status   Specimen Description URINE, CLEAN CATCH   Final   Special Requests NONE   Final   Culture  Setup Time 08/03/2012 03:38   Final   Colony Count NO GROWTH   Final   Culture NO GROWTH   Final   Report Status 08/04/2012 FINAL   Final  CULTURE, ROUTINE-ABSCESS     Status: None   Collection Time    08/06/12  3:31 PM      Result Value Range Status   Specimen Description LIVER ABSCESS   Final   Special Requests Normal   Final   Gram Stain     Final   Value: NO WBC SEEN     NO SQUAMOUS EPITHELIAL CELLS SEEN     NO ORGANISMS SEEN   Culture RARE VIRIDANS STREPTOCOCCUS   Final   Report Status 08/10/2012 FINAL   Final  ANAEROBIC CULTURE     Status: None   Collection Time     08/06/12  3:31 PM      Result Value Range Status   Specimen Description LIVER ABSCESS   Final   Special Requests Normal   Final   Gram Stain     Final   Value: NO WBC SEEN     NO SQUAMOUS EPITHELIAL CELLS SEEN     NO ORGANISMS SEEN   Culture NO ANAEROBES ISOLATED   Final   Report Status 08/11/2012 FINAL   Final     Labs: Basic Metabolic Panel:  Recent Labs Lab 08/05/12 0515 08/06/12 0455 08/07/12 0505 08/08/12 0536  NA 137 136 139 139  K 3.1* 3.9 4.5 4.1  CL 105 103 106 106  CO2 24 24 25 25   GLUCOSE 107* 106* 110* 94  BUN 4* 4* 6 7  CREATININE 0.59 0.60 0.56 0.65  CALCIUM 8.0* 8.2* 8.5 8.3*   Liver Function Tests:  Recent Labs Lab 08/07/12 0505 08/08/12 0536  AST 23 19  ALT 19 15  ALKPHOS 164* 148*  BILITOT 1.6* 1.0  PROT 5.9* 5.9*  ALBUMIN 1.7* 1.6*   No results found for this basename: LIPASE, AMYLASE,  in the last 168 hours No results found for this basename: AMMONIA,  in the last 168 hours CBC:  Recent Labs Lab 08/05/12 0515 08/06/12 0455 08/07/12 0505 08/08/12 0536 08/10/12 0425  WBC 17.9* 17.9* 17.5* 12.1* 10.7*  HGB 9.3* 8.8* 8.9* 9.0* 8.8*  HCT 26.0* 24.3* 24.5* 24.8* 25.7*  MCV 78.3 78.4 78.3 78.7 80.3  PLT 344 320 387 397 379   Cardiac Enzymes: No results found for this basename: CKTOTAL, CKMB, CKMBINDEX, TROPONINI,  in the last 168 hours BNP: BNP (last 3 results) No results found for this basename: PROBNP,  in the last 8760 hours CBG: No results found for this basename: GLUCAP,  in the last 168 hours  Additional labs:  Anemia Panel: Iron 20, TIBC 192, saturation ratio 10, Ferritin 457, Folate: 16.3, B12: 1079  TSH: 1.214  AFP: 2.1  RPR: Non reactive  HIV Ab: Non reactive  Liver abscess drainage: cytopathology Diagnosis  PERITONEAL/ASCITIC FLUID, RIGHT LOBE HEPATIC DRAIN FLUID:  NO MALIGNANT CELLS IDENTIFIED.  ACUTE INFLAMMATION.   Signed:  Maico Mulvehill  [facility] 08/11/2012, 3:21 PM

## 2012-08-11 NOTE — Progress Notes (Signed)
INFECTIOUS DISEASE PROGRESS NOTE  ID: Sydney Scott is a 79 y.o. female with  Active Problems:   Leukocytosis, unspecified   Anemia   Hypokalemia   HAP (hospital-acquired pneumonia)   Bacteremia   Liver abscess  Subjective: Without complaints  Abtx:  Anti-infectives   Start     Dose/Rate Route Frequency Ordered Stop   08/11/12 1100  cefTRIAXone (ROCEPHIN) 1 g in dextrose 5 % 50 mL IVPB     1 g 100 mL/hr over 30 Minutes Intravenous Daily 08/11/12 1033     08/04/12 0000  vancomycin (VANCOCIN) IVPB 1000 mg/200 mL premix  Status:  Discontinued     1,000 mg 200 mL/hr over 60 Minutes Intravenous Every 12 hours 08/04/12 1920 08/04/12 2037   08/03/12 2200  metroNIDAZOLE (FLAGYL) tablet 1,000 mg     1,000 mg Oral  Once 08/03/12 1159 08/03/12 2150   08/03/12 1000  metroNIDAZOLE (FLAGYL) tablet 1,000 mg     1,000 mg Oral  Once 08/03/12 0901 08/03/12 0942   08/03/12 0200  piperacillin-tazobactam (ZOSYN) IVPB 3.375 g  Status:  Discontinued     3.375 g 12.5 mL/hr over 240 Minutes Intravenous Every 8 hours 08/02/12 1710 08/11/12 1033   08/02/12 1800  vancomycin (VANCOCIN) 500 mg in sodium chloride 0.9 % 100 mL IVPB     500 mg 100 mL/hr over 60 Minutes Intravenous Every 12 hours 08/02/12 1710 08/04/12 1931   08/02/12 1700  piperacillin-tazobactam (ZOSYN) IVPB 3.375 g     3.375 g 100 mL/hr over 30 Minutes Intravenous  Once 08/02/12 1656 08/02/12 1859      Medications:  Scheduled: . cefTRIAXone (ROCEPHIN)  IV  1 g Intravenous Daily  . heparin  5,000 Units Subcutaneous Q8H  . potassium chloride  40 mEq Oral BID  . sodium chloride  3 mL Intravenous Q12H    Objective: Vital signs in last 24 hours: Temp:  [97.1 F (36.2 C)-98.8 F (37.1 C)] 98.8 F (37.1 C) (07/29 0616) Pulse Rate:  [88-94] 88 (07/29 0616) Resp:  [18-20] 18 (07/29 0616) BP: (123-159)/(60-84) 159/60 mmHg (07/29 0616) SpO2:  [93 %-96 %] 94 % (07/29 0616)   General appearance: alert, cooperative and no  distress Resp: clear to auscultation bilaterally Cardio: regular rate and rhythm GI: normal findings: bowel sounds normal and soft, non-tender RUE PIC  Lab Results  Recent Labs  08/10/12 0425  WBC 10.7*  HGB 8.8*  HCT 25.7*   Liver Panel No results found for this basename: PROT, ALBUMIN, AST, ALT, ALKPHOS, BILITOT, BILIDIR, IBILI,  in the last 72 hours Sedimentation Rate No results found for this basename: ESRSEDRATE,  in the last 72 hours C-Reactive Protein No results found for this basename: CRP,  in the last 72 hours  Microbiology: Recent Results (from the past 240 hour(s))  CULTURE, BLOOD (ROUTINE X 2)     Status: None   Collection Time    08/02/12  4:40 PM      Result Value Range Status   Specimen Description BLOOD RIGHT HAND   Final   Special Requests NONE   Final   Culture  Setup Time 08/02/2012 20:54   Final   Culture NO GROWTH 5 DAYS   Final   Report Status 08/08/2012 FINAL   Final  CULTURE, BLOOD (ROUTINE X 2)     Status: None   Collection Time    08/02/12  5:34 PM      Result Value Range Status   Specimen Description BLOOD LEFT ANTECUBITAL  Final   Special Requests BOTTLES DRAWN AEROBIC AND ANAEROBIC 4CC   Final   Culture  Setup Time 08/02/2012 20:47   Final   Culture NO GROWTH 5 DAYS   Final   Report Status 08/08/2012 FINAL   Final  URINE CULTURE     Status: None   Collection Time    08/02/12  6:41 PM      Result Value Range Status   Specimen Description URINE, CLEAN CATCH   Final   Special Requests NONE   Final   Culture  Setup Time 08/03/2012 03:38   Final   Colony Count NO GROWTH   Final   Culture NO GROWTH   Final   Report Status 08/04/2012 FINAL   Final  CULTURE, ROUTINE-ABSCESS     Status: None   Collection Time    08/06/12  3:31 PM      Result Value Range Status   Specimen Description LIVER ABSCESS   Final   Special Requests Normal   Final   Gram Stain     Final   Value: NO WBC SEEN     NO SQUAMOUS EPITHELIAL CELLS SEEN     NO ORGANISMS  SEEN   Culture RARE VIRIDANS STREPTOCOCCUS   Final   Report Status 08/10/2012 FINAL   Final  ANAEROBIC CULTURE     Status: None   Collection Time    08/06/12  3:31 PM      Result Value Range Status   Specimen Description LIVER ABSCESS   Final   Special Requests Normal   Final   Gram Stain     Final   Value: NO WBC SEEN     NO SQUAMOUS EPITHELIAL CELLS SEEN     NO ORGANISMS SEEN   Culture NO ANAEROBES ISOLATED   Final   Report Status 08/11/2012 FINAL   Final    Studies/Results: No results found.   Assessment/Plan: Liver Abscess   Viridans strep  Drain out 20 mL yesterday HTN  Total days of antibiotics 10 (zosyn)  Discussed with Dr Waymon Amato- will arrange for her to have ID f/u as outpt in 1-2 weeks. She will continue on ceftriaxone until that time and she has f/u CT abd.  pcp to address BP          [name] Infectious Diseases (pager) [phone] [email or web address] 08/11/2012, 1:39 PM  LOS: 9 days

## 2012-08-11 NOTE — Procedures (Signed)
Successful placement of dual lumen PICC line to right brachial vein. Length 44cm Tip at lower SVC/RA No complications Ready for use.  Brayton El PA-C Interventional Radiology 08/11/2012 1:36 PM

## 2012-08-11 NOTE — Progress Notes (Signed)
Spoke with Brayton El, PA regarding orders for home care of JP drain.  States will write home care orders for discharge.

## 2012-08-11 NOTE — Progress Notes (Signed)
Patient discharged home in stable condition.  Home with PICC and home RN.  No change from morning assessment.  Teaching provided to pt and family regarding IV antibiotics by [facility] RN.  Discharge instructions and script provided to pt and family.  Teaching provided regarding how to care for JP drain including signs/symtoms of infection.

## 2012-08-11 NOTE — Progress Notes (Addendum)
Subjective: Pt feeling ok. Eating pretty good. Ready to go home.  Objective: Physical Exam: BP 159/60  Pulse 88  Temp(Src) 98.8 F (37.1 C) (Oral)  Resp 18  Ht 5\' 4"  (1.626 m)  Wt 153 lb (69.4 kg)  BMI 26.25 kg/m2  SpO2 94% Drain intact, site clean. Output thin serosanguinous, 20cc recorded yesterday   Labs: CBC  Recent Labs  08/10/12 0425  WBC 10.7*  HGB 8.8*  HCT 25.7*  PLT 379   BMET No results found for this basename: NA, K, CL, CO2, GLUCOSE, BUN, CREATININE, CALCIUM,  in the last 72 hours LFT No results found for this basename: PROT, ALBUMIN, AST, ALT, ALKPHOS, BILITOT, BILIDIR, IBILI, LIPASE,  in the last 72 hours  Assessment/Plan: S/p rt lobe hepatic abscess drainage 7/24 Cx= Strep Viridans. WBC nearing normal, afebrile and clinically looking better. Output steadily decreasing. D/W primary who is planning for discharge today. IR has no problem with discharge. Drain is functioning properly. [facility] is service that originally ordered drain, but has subsequently signed off.  IR recommends follow up CT Abdomen/Pelvis with contrast within 1 week and can be done as outpt. Prefer scan be done at Imaging center([phone]), as provider can be available to eval drain and remove if indicated.   LOS: 9 days    Brayton El PA-C 08/11/2012 10:24 AM

## 2012-08-11 NOTE — Progress Notes (Signed)
Physical Therapy Treatment Patient Details Name: Sydney Scott MRN: [id number] DOB: Nov 28, 1927 Today's Date: 08/11/2012 Time: 7829-5621 PT Time Calculation (min): 16 min  PT Assessment / Plan / Recommendation  History of Present Illness 79 yo female admitted with Pna. S/p drain placement for abscess R flank   Clinical Impression    PT Comments   Pt continuing to mobilize well. No LOB during session. Feel pt has met acute PT goals-no further PT needs at this time. Will d/c pt from PT. Recommend daily mobility for remainder of hospital stay.   Follow Up Recommendations  No PT follow up     Does the patient have the potential to tolerate intense rehabilitation     Barriers to Discharge        Equipment Recommendations  None recommended by PT    Recommendations for Other Services    Frequency Min 3X/week   Progress towards PT Goals Progress towards PT goals: Goals met/education completed, patient discharged from PT  Plan Current plan remains appropriate    Precautions / Restrictions Precautions Precautions: None Restrictions Weight Bearing Restrictions: No   Pertinent Vitals/Pain R side (where drain is)-5/10 with activity    Mobility  Bed Mobility Bed Mobility: Not assessed Transfers Transfers: Sit to Stand;Stand to Sit Sit to Stand: 6: Modified independent (Device/Increase time) Stand to Sit: 6: Modified independent (Device/Increase time) Ambulation/Gait Ambulation/Gait Assistance: 6: Modified independent (Device/Increase time) Ambulation Distance (Feet): 350 Feet Assistive device: None Ambulation/Gait Assistance Details: Pt ambulating well-negotiated obstacles without difficulty. No LOB.  Gait Pattern: Step-through pattern Stairs: No    Exercises     PT Diagnosis:    PT Problem List:   PT Treatment Interventions:     PT Goals (current goals can now be found in the care plan section)    Visit Information  Last PT Received On: 08/11/12 Assistance Needed:  +1 History of Present Illness: 79 yo female admitted with Pna. S/p drain placement for abscess R flank    Subjective Data      Cognition  Cognition Arousal/Alertness: Awake/alert Behavior During Therapy: WFL for tasks assessed/performed Overall Cognitive Status: Within Functional Limits for tasks assessed    Balance  Balance Balance Assessed: Yes Static Standing Balance Static Standing - Balance Support: No upper extremity supported;During functional activity Static Standing - Level of Assistance: 6: Modified independent (Device/Increase time) Dynamic Standing Balance Dynamic Standing - Balance Support: No upper extremity supported;During functional activity Dynamic Standing - Level of Assistance: 6: Modified independent (Device/Increase time) High Level Balance High Level Balance Activites: Direction changes;Turns;Sudden stops;Head turns High Level Balance Comments: No difficulty.   End of Session PT - End of Session Activity Tolerance: Patient tolerated treatment well Patient left: in chair;with call bell/phone within reach   GP     Rebeca Alert, MPT Pager: [phone]

## 2012-08-17 ENCOUNTER — Ambulatory Visit
Admission: RE | Admit: 2012-08-17 | Discharge: 2012-08-17 | Disposition: A | Discharge status: Home / Self Care | Payer: Medicare Other | Source: Ambulatory Visit | Attending: Radiology | Admitting: Radiology

## 2012-08-17 DIAGNOSIS — K750 Abscess of liver: Secondary | ICD-10-CM

## 2012-08-17 MED ORDER — IOHEXOL 300 MG/ML  SOLN
100.0000 mL | Freq: Once | INTRAMUSCULAR | Status: AC | PRN
  Administered 2012-08-17: 100 mL via INTRAVENOUS

## 2012-08-26 ENCOUNTER — Encounter (HOSPITAL_COMMUNITY): Payer: Self-pay | Admitting: Emergency Medicine

## 2012-08-26 ENCOUNTER — Telehealth: Payer: Self-pay | Admitting: *Deleted

## 2012-08-26 ENCOUNTER — Encounter: Payer: Self-pay | Admitting: Infectious Diseases

## 2012-08-26 ENCOUNTER — Emergency Department (HOSPITAL_COMMUNITY)
Admission: EM | Admit: 2012-08-26 | Discharge: 2012-08-26 | Disposition: A | Discharge status: Home / Self Care | Payer: Medicare Other | Attending: Emergency Medicine | Admitting: Emergency Medicine

## 2012-08-26 ENCOUNTER — Ambulatory Visit (INDEPENDENT_AMBULATORY_CARE_PROVIDER_SITE_OTHER): Payer: Medicare Other | Admitting: Infectious Diseases

## 2012-08-26 VITALS — BP 192/101 | HR 98 | Temp 98.2°F | Ht 64.0 in | Wt 141.0 lb

## 2012-08-26 DIAGNOSIS — I714 Abdominal aortic aneurysm, without rupture: Secondary | ICD-10-CM

## 2012-08-26 DIAGNOSIS — I10 Essential (primary) hypertension: Secondary | ICD-10-CM | POA: Insufficient documentation

## 2012-08-26 DIAGNOSIS — K750 Abscess of liver: Secondary | ICD-10-CM

## 2012-08-26 DIAGNOSIS — Z79899 Other long term (current) drug therapy: Secondary | ICD-10-CM | POA: Insufficient documentation

## 2012-08-26 DIAGNOSIS — Z87891 Personal history of nicotine dependence: Secondary | ICD-10-CM | POA: Insufficient documentation

## 2012-08-26 DIAGNOSIS — E876 Hypokalemia: Secondary | ICD-10-CM

## 2012-08-26 DIAGNOSIS — Z8739 Personal history of other diseases of the musculoskeletal system and connective tissue: Secondary | ICD-10-CM | POA: Insufficient documentation

## 2012-08-26 DIAGNOSIS — Z8679 Personal history of other diseases of the circulatory system: Secondary | ICD-10-CM | POA: Insufficient documentation

## 2012-08-26 NOTE — ED Notes (Signed)
Pt c/o hypertension x1 day.  Sts this morning pressure was 191/101.  Was seen by PCP this morning and told pt to come to ED.  Pt current pressure is 156/89.  Denies hx of HTN. Denies headache, blurred vision or chest pain.

## 2012-08-26 NOTE — Telephone Encounter (Signed)
Please disregard previous phone note, entered in error. Patient's picc line was pulled today and she is not on cefepime. Ticket put in to be removed from patient's chart. Sydney Scott

## 2012-08-26 NOTE — Assessment & Plan Note (Signed)
Will have pt seen by IR for removal of drain. Will stop her anbx as planned, remover her PIC. Repeat her BCx. See her back prn

## 2012-08-26 NOTE — Assessment & Plan Note (Signed)
Will try to get her established with PCP

## 2012-08-26 NOTE — Progress Notes (Signed)
  Subjective:    Patient ID: Sydney Scott, female    DOB: 1927/12/27, 79 y.o.   MRN: [id number]  HPI 79 yo F with adm to [facility] on 7-20 to 08-11-12 with suspected PNA. She was started on vanco/zosyn. She continued to have fever in hospital and underwent CT abd and was found to have liver abscess. She had BCx+ microaerophillic strep. [name] underwent IR drain placement and this Cx grew viridans strep. She was d/c home on 08-11-12 with IV ceftriaxone as well as with drain in place. She had repeat CT abdomen on 8-4 showing near resolution of her abscess as well as a 4.1 cm AAA.  Today in clinic she is noted to have significantly elevated BP.  She still has her drain in place. There has been no d/c from drain. Not getting IV anbx anymore (got last dose yesterday), taking only potassium.   Review of Systems  Constitutional: Negative for fever, chills and appetite change.  Respiratory: Negative for shortness of breath.   Cardiovascular: Negative for chest pain.  Gastrointestinal: Negative for abdominal pain, diarrhea and constipation.  Genitourinary: Negative for dysuria.  Neurological: Negative for headaches.       Objective:   Physical Exam  Constitutional: She appears well-developed and well-nourished.  Eyes: EOM are normal. Pupils are equal, round, and reactive to light.  Neck: Neck supple.  Cardiovascular: Normal rate, regular rhythm and normal heart sounds.   Pulmonary/Chest: Effort normal and breath sounds normal.  Abdominal: Soft. Bowel sounds are normal. There is no tenderness.  RUQ drain site is clean, non-tender. No d/c.   Musculoskeletal: She exhibits no edema.       Arms: Lymphadenopathy:    She has no cervical adenopathy.          Assessment & Plan:

## 2012-08-26 NOTE — Assessment & Plan Note (Signed)
Her BP is significantly elevated today, she is asx, will have her seen asap.

## 2012-08-26 NOTE — ED Provider Notes (Signed)
CSN: [id number]     Arrival date & time 08/26/12  1350 History     First MD Initiated Contact with Patient 08/26/12 1504     Chief Complaint  Patient presents with  . Hypertension   (Consider location/radiation/quality/duration/timing/severity/associated sxs/prior Treatment) Patient is a 79 y.o. female presenting with hypertension. The history is provided by medical records (pt here for bp and to remove a hepatic drain).  Hypertension This is a recurrent problem. The current episode started 2 days ago. The problem occurs constantly. The problem has not changed since onset.Pertinent negatives include no chest pain, no abdominal pain and no headaches. Nothing aggravates the symptoms. Nothing relieves the symptoms.    Past Medical History  Diagnosis Date  . Arthritis   . Hypertension   . AAA (abdominal aortic aneurysm)     4.1 cm on CT 08-17-12   Past Surgical History  Procedure Laterality Date  . No past surgeries     Family History  Problem Relation Age of Onset  . CVA Mother   . Heart failure Father   . Stroke Mother    History  Substance Use Topics  . Smoking status: Former Smoker -- 20 years    Types: Cigarettes    Quit date: 07/28/1978  . Smokeless tobacco: Never Used  . Alcohol Use: No   OB History   Grav Para Term Preterm Abortions TAB SAB Ect Mult Living                 Review of Systems  Constitutional: Negative for appetite change and fatigue.  HENT: Negative for congestion, sinus pressure and ear discharge.   Eyes: Negative for discharge.  Respiratory: Negative for cough.   Cardiovascular: Negative for chest pain.  Gastrointestinal: Negative for abdominal pain and diarrhea.  Genitourinary: Negative for frequency and hematuria.  Musculoskeletal: Negative for back pain.  Skin: Negative for rash.  Neurological: Negative for seizures and headaches.  Psychiatric/Behavioral: Negative for hallucinations.    Allergies  Aspirin  Home Medications    Current Outpatient Rx  Name  Route  Sig  Dispense  Refill  . potassium chloride SA (K-DUR,KLOR-CON) 20 MEQ tablet   Oral   Take 2 tablets (40 mEq total) by mouth daily.   60 tablet   0   . dextrose 5 % SOLN 50 mL with cefTRIAXone 1 G SOLR 1 g   Intravenous   Inject 1 g into the vein daily. Duration of treatment to be determined by Dr. [name], Infectious Disease MD. Continue until OP follow up with him in 1-2 weeks.          BP 152/89  Pulse 95  Temp(Src) 97.5 F (36.4 C) (Oral)  Resp 14  SpO2 99% Physical Exam  Constitutional: She is oriented to person, place, and time. She appears well-developed.  HENT:  Head: Normocephalic.  Eyes: Conjunctivae and EOM are normal. No scleral icterus.  Neck: Neck supple. No thyromegaly present.  Cardiovascular: Normal rate and regular rhythm.  Exam reveals no gallop and no friction rub.   No murmur heard. Pulmonary/Chest: No stridor. She has no wheezes. She has no rales. She exhibits no tenderness.  Abdominal: She exhibits no distension. There is no tenderness. There is no rebound.  Drain in abd for liver abscess.  Musculoskeletal: Normal range of motion. She exhibits no edema.  Lymphadenopathy:    She has no cervical adenopathy.  Neurological: She is oriented to person, place, and time. Coordination normal.  Skin: No rash noted.  No erythema.  Psychiatric: She has a normal mood and affect. Her behavior is normal.    ED Course   Procedures (including critical care time)  Labs Reviewed - No data to display No results found. 1. Hypertension    ID md wanted drain out.  IR removed the drain MDM    Benny Lennert, MD 08/26/12 1610

## 2012-08-26 NOTE — Telephone Encounter (Signed)
Verbal order per Dr. Ninetta Lights given to Eunice Blase at [facility] to discontinue the Rocephin and start patient on cefipime 1 gram every 12 hours, this is in addition to the vancomycin and mycafugin.  Also weekly lab draws for CRP and Sed rate with results to be faxed to this clinic. Wendall Mola CMA

## 2012-08-26 NOTE — Progress Notes (Signed)
RN received verbal order to discontinue the patient's PICC line.  Patient identified with name and date of birth. PICC dressing removed, site unremarkable.  Suture removed without difficulty.  PICC line removed using sterile procedure @ 1145. PICC length equal to that noted in patient's hospital chart of 44 cm. Sterile petroleum gauze + sterile 4X4 applied to PICC site, pressure applied for 10 minutes and covered with Medipore tape as a pressure dressing. Patient tolerated procedure without complaints.  Patient instructed to limit use of arm for 1 hour. Patient instructed that the pressure dressing should remain in place for 24 hours. Patient verbalized understanding of these instructions.

## 2012-08-26 NOTE — Addendum Note (Signed)
Addended by: Chasin Findling C on: 08/26/2012 12:01 PM   Modules accepted: Orders

## 2012-08-26 NOTE — Assessment & Plan Note (Signed)
It is not clear to me that she still needs K supplementation. Will check to make sure she is not hyperK

## 2012-08-26 NOTE — Progress Notes (Signed)
Patient ID: Sydney Scott, female   DOB: 1927-09-05, 79 y.o.   MRN: [id number] Per ID request and after d/w Drs. Zammitt(ER) ,Hoss(IR), pt's hepatic abscess drain was removed in its entirety without immediate complications. Gauze dressing was applied to site. Pt has had minimal output from drain and is currently afebrile. Recent CT abd was reviewed by Dr. Bonnielee Haff and revealed minimal fluid at site of abscess with predominantly phlegmonous changes.

## 2012-08-26 NOTE — ED Notes (Signed)
Most recent BP is 152/89. Pt had one reading of high BP and got refer here to the Emergency Department. Pt denied any symptoms. Alert, oriented. Stable.

## 2012-08-27 ENCOUNTER — Other Ambulatory Visit: Payer: Self-pay | Admitting: Infectious Diseases

## 2012-08-27 DIAGNOSIS — K750 Abscess of liver: Secondary | ICD-10-CM

## 2012-08-31 ENCOUNTER — Other Ambulatory Visit: Payer: Self-pay | Admitting: Internal Medicine

## 2012-09-01 LAB — BASIC METABOLIC PANEL
BUN: 10 mg/dL (ref 6–23)
CO2: 25 mEq/L (ref 19–32)
Calcium: 9.5 mg/dL (ref 8.4–10.5)
Chloride: 104 mEq/L (ref 96–112)
Creat: 0.67 mg/dL (ref 0.50–1.10)

## 2014-09-04 IMAGING — CR DG CHEST 2V
2 series · 2 of 2 positions shown · non-contrast
Comparison: 07/27/2012

CLINICAL DATA: Weakness

CHEST - 2 VIEW

[w chest pa]
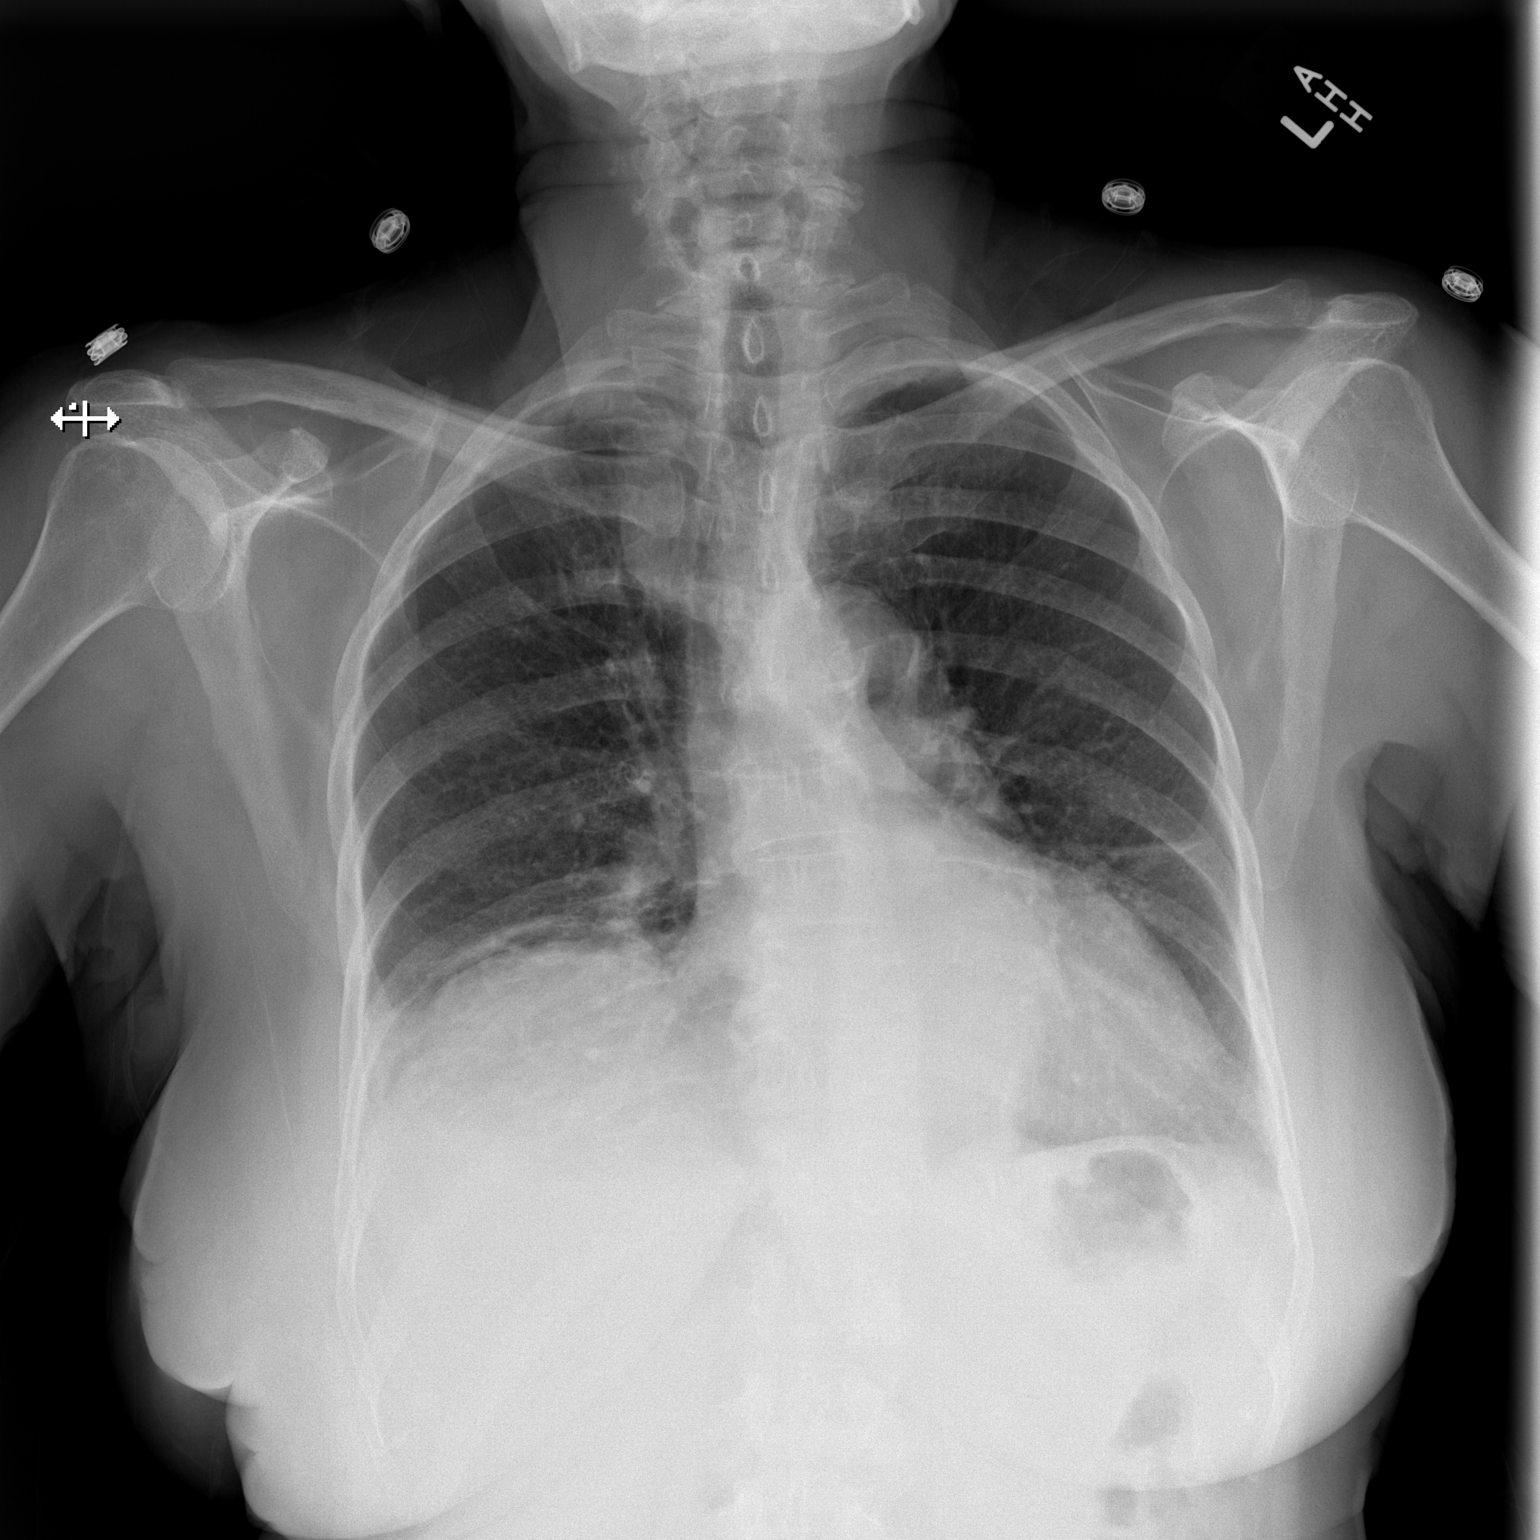

[w chest lat]
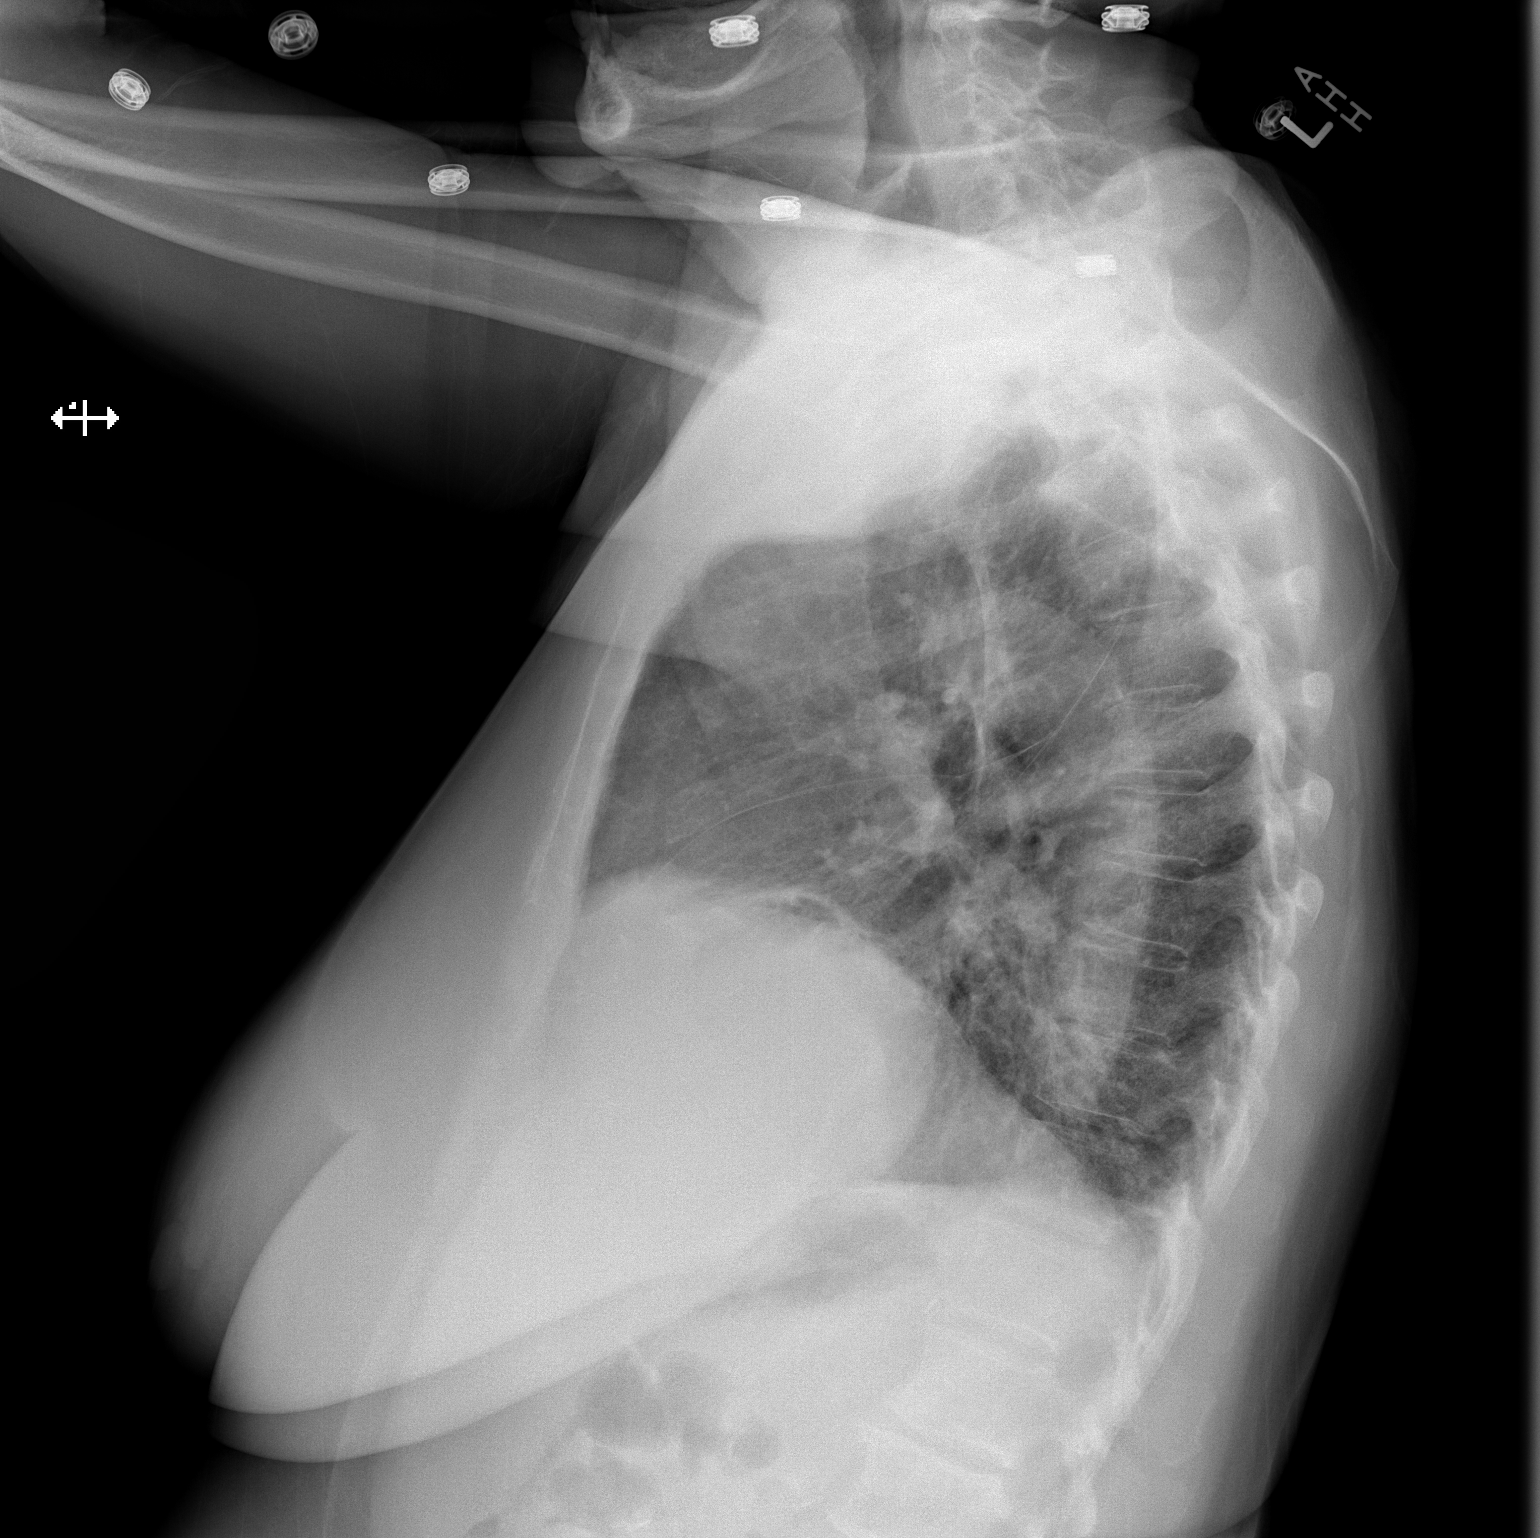

[2 of 2 positions shown; findings below may reference images not displayed]

FINDINGS: Small right pleural effusion.  Associated right basilar
opacity, atelectasis versus pneumonia.  Stable mild elevation of
the right hemidiaphragm.

The heart is normal in size.

Visualized osseous structures are within normal limits.
IMPRESSION: Small right pleural effusion.

Associated right basilar opacity, atelectasis versus pneumonia.

## 2014-09-13 IMAGING — US IR FLUORO GUIDE CV LINE*R*
1 series · 2 of 2 positions shown · non-contrast
Comparison: none

CLINICAL DATA: Patient with hepatic abscess requiring home IV
antibiotics.

[Series 1: ir fluoro guide cv line*right* · 2 of 2 slices shown]
[im 1/2]
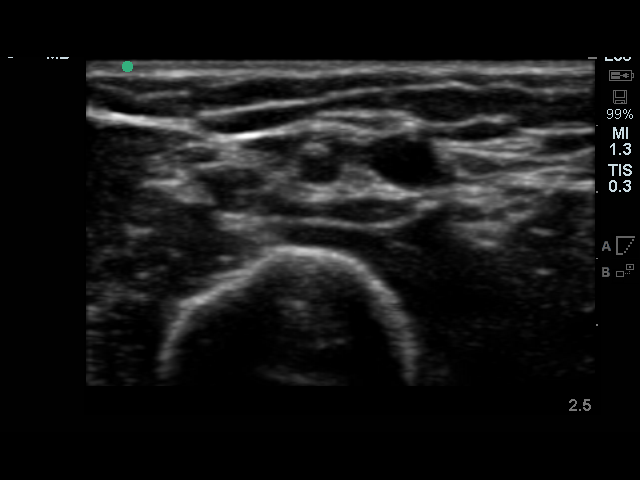
[im 2/2]
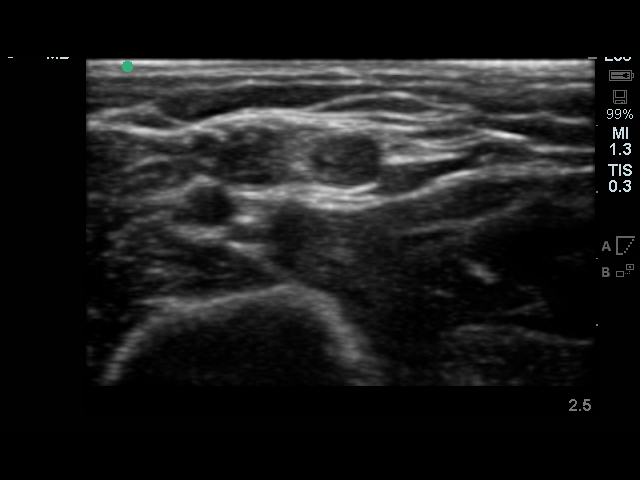

[2 of 2 positions shown; findings below may reference images not displayed]

Powered PICC LINE PLACEMENT WITH ULTRASOUND AND FLUOROSCOPIC
GUIDANCE

Fluoroscopy Time:  3 minutes, 30 seconds

The right arm was prepped with chlorhexidine, draped in the usual
sterile fashion using maximum barrier technique (cap and mask,
sterile gown, sterile gloves, large sterile sheet, hand hygiene and
cutaneous antisepsis) and infiltrated locally with 1% Lidocaine.

Ultrasound demonstrated patency of the right brachial vein, and
this was documented with an image.  Under real-time ultrasound
guidance, this vein was accessed with a 21 gauge micropuncture
needle and image documentation was performed.  The needle was
exchanged over a guidewire for a peel-away sheath through which a
five French dual lumen PICC trimmed to 44 cm was advanced,
positioned with its tip at the lower SVC/right atrial junction.
Fluoroscopy during the procedure and fluoro spot radiograph
confirms appropriate catheter position.  The catheter was flushed,
secured to the skin with Prolene sutures, and covered with a
sterile dressing.

Complications:  None
IMPRESSION: Successful right arm poweredPICC line placement with ultrasound and
fluoroscopic guidance.  The catheter is ready for use.

Read by Edda-Karin Amberg

## 2015-09-30 ENCOUNTER — Emergency Department (HOSPITAL_BASED_OUTPATIENT_CLINIC_OR_DEPARTMENT_OTHER)
Admission: EM | Admit: 2015-09-30 | Discharge: 2015-09-30 | Disposition: A | Discharge status: Home / Self Care | Payer: Medicare Other | Attending: Emergency Medicine | Admitting: Emergency Medicine

## 2015-09-30 ENCOUNTER — Emergency Department (HOSPITAL_BASED_OUTPATIENT_CLINIC_OR_DEPARTMENT_OTHER): Payer: Medicare Other

## 2015-09-30 ENCOUNTER — Encounter (HOSPITAL_BASED_OUTPATIENT_CLINIC_OR_DEPARTMENT_OTHER): Payer: Self-pay | Admitting: Emergency Medicine

## 2015-09-30 DIAGNOSIS — Y939 Activity, unspecified: Secondary | ICD-10-CM | POA: Diagnosis not present

## 2015-09-30 DIAGNOSIS — I10 Essential (primary) hypertension: Secondary | ICD-10-CM | POA: Insufficient documentation

## 2015-09-30 DIAGNOSIS — Y999 Unspecified external cause status: Secondary | ICD-10-CM | POA: Insufficient documentation

## 2015-09-30 DIAGNOSIS — M25572 Pain in left ankle and joints of left foot: Secondary | ICD-10-CM | POA: Insufficient documentation

## 2015-09-30 DIAGNOSIS — W109XXA Fall (on) (from) unspecified stairs and steps, initial encounter: Secondary | ICD-10-CM | POA: Diagnosis not present

## 2015-09-30 DIAGNOSIS — Y92009 Unspecified place in unspecified non-institutional (private) residence as the place of occurrence of the external cause: Secondary | ICD-10-CM | POA: Diagnosis not present

## 2015-09-30 DIAGNOSIS — Z87891 Personal history of nicotine dependence: Secondary | ICD-10-CM | POA: Insufficient documentation

## 2015-09-30 MED ORDER — KETOROLAC TROMETHAMINE 60 MG/2ML IM SOLN
20.0000 mg | Freq: Once | INTRAMUSCULAR | Status: AC
  Administered 2015-09-30: 20 mg via INTRAMUSCULAR
  Filled 2015-09-30: qty 2

## 2015-09-30 MED ORDER — IBUPROFEN 400 MG PO TABS: 400.0000 mg | ORAL_TABLET | Freq: Four times a day (QID) | ORAL | 0 refills | Status: AC | PRN

## 2015-09-30 MED ORDER — OXYCODONE-ACETAMINOPHEN 5-325 MG PO TABS: 1.0000 | ORAL_TABLET | Freq: Two times a day (BID) | ORAL | 0 refills | Status: DC | PRN

## 2015-09-30 MED ORDER — OXYCODONE-ACETAMINOPHEN 5-325 MG PO TABS
1.0000 | ORAL_TABLET | Freq: Once | ORAL | Status: AC
  Administered 2015-09-30: 1 via ORAL
  Filled 2015-09-30: qty 1

## 2015-09-30 NOTE — ED Provider Notes (Signed)
[facility] Provider Note   CSN: [id number] Arrival date & time: 09/30/15  0908     History   Chief Complaint Chief Complaint  Patient presents with  . Ankle Pain    HPI Sydney Scott is a 82 y.o. female.   Ankle Pain   The incident occurred more than 1 week ago. The incident occurred at home. The injury mechanism was a fall. The pain is present in the left ankle. The quality of the pain is described as aching and sharp. The pain is mild. The pain has been intermittent since onset. Pertinent negatives include no loss of motion and no loss of sensation. She reports no foreign bodies present. Nothing aggravates the symptoms. She has tried nothing for the symptoms. The treatment provided no relief.    Past Medical History:  Diagnosis Date  . AAA (abdominal aortic aneurysm) (HCC)    4.1 cm on CT 08-17-12  . Arthritis   . Hypertension     Patient Active Problem List   Diagnosis Date Noted  . HTN (hypertension) 08/26/2012  . AAA (abdominal aortic aneurysm) without rupture (HCC) 08/26/2012  . Liver abscess 08/06/2012  . Bacteremia 08/02/2012  . Weakness 07/27/2012  . UTI (urinary tract infection) 07/27/2012  . Leukocytosis, unspecified 07/27/2012  . Anemia 07/27/2012  . Hypokalemia 07/27/2012    Past Surgical History:  Procedure Laterality Date  . NO PAST SURGERIES      OB History    No data available       Home Medications    Prior to Admission medications   Medication Sig Start Date End Date Taking? Authorizing Provider  dextrose 5 % SOLN 50 mL with cefTRIAXone 1 G SOLR 1 g Inject 1 g into the vein daily. Duration of treatment to be determined by Dr. Johny SaxJeffrey Hatcher, Infectious Disease MD. Continue until OP follow up with him in 1-2 weeks. 08/11/12   Elease EtienneAnand D Hongalgi, MD  ibuprofen (ADVIL,MOTRIN) 400 MG tablet Take 1 tablet (400 mg total) by mouth every 6 (six) hours as needed. 09/30/15   Marily MemosJason Nadiah Corbit, MD  oxyCODONE-acetaminophen (PERCOCET) 5-325 MG  tablet Take 1 tablet by mouth every 12 (twelve) hours as needed. 09/30/15   Marily MemosJason Rakeb Kibble, MD  potassium chloride SA (K-DUR,KLOR-CON) 20 MEQ tablet Take 2 tablets (40 mEq total) by mouth daily. 08/11/12   Elease EtienneAnand D Hongalgi, MD    Family History Family History  Problem Relation Age of Onset  . CVA Mother   . Stroke Mother   . Heart failure Father     Social History Social History  Substance Use Topics  . Smoking status: Former Smoker    Years: 20.00    Types: Cigarettes    Quit date: 07/28/1978  . Smokeless tobacco: Never Used  . Alcohol use No     Allergies   Aspirin   Review of Systems Review of Systems  Musculoskeletal: Positive for arthralgias (left ankle), gait problem and joint swelling.  All other systems reviewed and are negative.    Physical Exam Updated Vital Signs BP 146/99 (BP Location: Right Arm)   Pulse 87   Temp 97.6 F (36.4 C) (Oral)   Resp 18   Ht 5\' 3"  (1.6 m)   Wt 130 lb (59 kg)   SpO2 98%   BMI 23.03 kg/m   Physical Exam  Constitutional: She appears well-developed and well-nourished. No distress.  HENT:  Head: Normocephalic and atraumatic.  Eyes: Conjunctivae are normal.  Neck: Neck supple.  Cardiovascular: Normal  rate and regular rhythm.   No murmur heard. Pulmonary/Chest: Effort normal and breath sounds normal. No respiratory distress.  Abdominal: Soft. There is no tenderness.  Musculoskeletal: She exhibits edema and tenderness.  Ankle pain with ROM, slight swelling, no redness, induration or erythema present  Neurological: She is alert.  Skin: Skin is warm and dry.  Psychiatric: She has a normal mood and affect.  Nursing note and vitals reviewed.    ED Treatments / Results  Labs (all labs ordered are listed, but only abnormal results are displayed) Labs Reviewed - No data to display  EKG  EKG Interpretation None       Radiology Dg Ankle Complete Left  Result Date: 09/30/2015 CLINICAL DATA:  Patient with fall down the  stairs 2 months prior. Worsening pain over the last week, about the lateral malleolus. Initial encounter. EXAM: LEFT ANKLE COMPLETE - 3+ VIEW COMPARISON:  None. FINDINGS: Normal anatomic alignment. No evidence for acute fracture or dislocation of the ankle. There is irregularity about the base of the fifth metatarsal. Ankle mortise is intact. Regional soft tissues are unremarkable. IMPRESSION: Irregularity about the base of the fifth metatarsal is nonspecific and may be sequelae of prior trauma. Acute injury is not excluded. Recommend correlation with point tenderness as well as dedicated foot radiographs. Ankle mortise appears intact without acute osseous injury. Electronically Signed   By: Annia Belt M.D.   On: 09/30/2015 09:40    Procedures Procedures (including critical care time)  Medications Ordered in ED Medications  oxyCODONE-acetaminophen (PERCOCET/ROXICET) 5-325 MG per tablet 1 tablet (1 tablet Oral Given 09/30/15 1021)  ketorolac (TORADOL) injection 20 mg (20 mg Intramuscular Given 09/30/15 1021)     Initial Impression / Assessment and Plan / ED Course  I have reviewed the triage vital signs and the nursing notes.  Pertinent labs & imaging results that were available during my care of the patient were reviewed by me and considered in my medical decision making (see chart for details).  Clinical Course   Intermittent ankle pain since fall a couple weeks ago. Bad again today. 'cramping' in nature. Exam overall benign but does have some pain with ROM and weight bearing.  Doubt septic joint, gout or other articular disease. Possibly recurring ligamentous/tendon strain/pain. Will plan for RICE, conservative therapy for a few days and if not improved will follow up with orthopedics for further managemet.    Final Clinical Impressions(s) / ED Diagnoses   Final diagnoses:  Left ankle pain    New Prescriptions New Prescriptions   IBUPROFEN (ADVIL,MOTRIN) 400 MG TABLET    Take 1  tablet (400 mg total) by mouth every 6 (six) hours as needed.   OXYCODONE-ACETAMINOPHEN (PERCOCET) 5-325 MG TABLET    Take 1 tablet by mouth every 12 (twelve) hours as needed.     Marily Memos, MD 09/30/15 [phone]

## 2015-09-30 NOTE — ED Triage Notes (Signed)
Pt fell down stairs and injured left ankle approx 2 months ago and did not receive tx at that time.  Pt states pain and swelling subsided, but that in past week left ankle has begun to hurt again.  Pt states pain with weight bearing and palpation and that pain wakes her up during the night.

## 2015-09-30 NOTE — ED Notes (Signed)
Dr Mesner in room with patient now. 

## 2015-10-06 ENCOUNTER — Encounter (HOSPITAL_COMMUNITY): Payer: Self-pay | Admitting: Emergency Medicine

## 2015-10-06 ENCOUNTER — Emergency Department (HOSPITAL_COMMUNITY)
Admission: EM | Admit: 2015-10-06 | Discharge: 2015-10-06 | Disposition: A | Discharge status: Home / Self Care | Payer: Medicare Other | Attending: Emergency Medicine | Admitting: Emergency Medicine

## 2015-10-06 ENCOUNTER — Emergency Department (HOSPITAL_COMMUNITY): Payer: Medicare Other

## 2015-10-06 DIAGNOSIS — Z79899 Other long term (current) drug therapy: Secondary | ICD-10-CM | POA: Diagnosis not present

## 2015-10-06 DIAGNOSIS — Y999 Unspecified external cause status: Secondary | ICD-10-CM | POA: Insufficient documentation

## 2015-10-06 DIAGNOSIS — Y929 Unspecified place or not applicable: Secondary | ICD-10-CM | POA: Diagnosis not present

## 2015-10-06 DIAGNOSIS — S92352A Displaced fracture of fifth metatarsal bone, left foot, initial encounter for closed fracture: Secondary | ICD-10-CM | POA: Insufficient documentation

## 2015-10-06 DIAGNOSIS — I10 Essential (primary) hypertension: Secondary | ICD-10-CM | POA: Diagnosis not present

## 2015-10-06 DIAGNOSIS — Y939 Activity, unspecified: Secondary | ICD-10-CM | POA: Insufficient documentation

## 2015-10-06 DIAGNOSIS — Z87891 Personal history of nicotine dependence: Secondary | ICD-10-CM | POA: Insufficient documentation

## 2015-10-06 DIAGNOSIS — S99922A Unspecified injury of left foot, initial encounter: Secondary | ICD-10-CM | POA: Diagnosis present

## 2015-10-06 DIAGNOSIS — X58XXXA Exposure to other specified factors, initial encounter: Secondary | ICD-10-CM | POA: Diagnosis not present

## 2015-10-06 DIAGNOSIS — S92302A Fracture of unspecified metatarsal bone(s), left foot, initial encounter for closed fracture: Secondary | ICD-10-CM

## 2015-10-06 MED ORDER — OXYCODONE-ACETAMINOPHEN 5-325 MG PO TABS
1.0000 | ORAL_TABLET | Freq: Once | ORAL | Status: AC
  Administered 2015-10-06: 1 via ORAL
  Filled 2015-10-06: qty 1

## 2015-10-06 MED ORDER — HYDROCODONE-ACETAMINOPHEN 5-325 MG PO TABS: 1.0000 | ORAL_TABLET | Freq: Four times a day (QID) | ORAL | 0 refills | Status: DC | PRN

## 2015-10-06 NOTE — ED Notes (Signed)
Pt transported to X-ray by [employer].

## 2015-10-06 NOTE — ED Triage Notes (Signed)
Pt from home with complaints of left foot pain. Pt states she broke her foot "years ago".  Pt states that she was seen for this pain on Sat and was told that her pain is related to her old fracture. Pt has a boot on her foot that she has had pain for about 2 weeks.

## 2015-10-06 NOTE — Discharge Instructions (Signed)
Return here as needed.  Elevate the foot.  Follow-up with the orthopedist provided

## 2015-10-06 NOTE — ED Notes (Signed)
Pt taken out of department with a wheelchair.  No reaction to medication noted.

## 2015-10-06 NOTE — ED Notes (Signed)
Ortho Tech paged about splint

## 2015-10-06 NOTE — ED Provider Notes (Signed)
[facility] Provider Note   CSN: [id number] Arrival date & time: 10/06/15  1921   By signing my name below, I, Christel Mormon, attest that this documentation has been prepared under the direction and in the presence of [employer], PA-C. Electronically Signed: Christel Mormon, Scribe. 10/06/2015. 8:25 PM.   History   Chief Complaint Chief Complaint  Patient presents with  . Foot Pain    The history is provided by the patient. No language interpreter was used.   HPI Comments:  Sydney Scott is a 82 y.o. female who presents to the Emergency Department complaining of constant L foot pain. Pt states that she broke her foot several years ago and believes that the pain is related to her previous injury. Pt states that the pain starts at her ankle and radiates all around her foot. Pt was seen [facility] days ago and was given a boot, motrin and a steroid with not relief. Pt denies other symptoms.   Past Medical History:  Diagnosis Date  . AAA (abdominal aortic aneurysm) (HCC)    4.1 cm on CT 08-17-12  . Arthritis   . Hypertension     Patient Active Problem List   Diagnosis Date Noted  . HTN (hypertension) 08/26/2012  . AAA (abdominal aortic aneurysm) without rupture (HCC) 08/26/2012  . Liver abscess 08/06/2012  . Bacteremia 08/02/2012  . Weakness 07/27/2012  . UTI (urinary tract infection) 07/27/2012  . Leukocytosis, unspecified 07/27/2012  . Anemia 07/27/2012  . Hypokalemia 07/27/2012    Past Surgical History:  Procedure Laterality Date  . NO PAST SURGERIES      OB History    No data available       Home Medications    Prior to Admission medications   Medication Sig Start Date End Date Taking? Authorizing Provider  dextrose 5 % SOLN 50 mL with cefTRIAXone 1 G SOLR 1 g Inject 1 g into the vein daily. Duration of treatment to be determined by Dr. [name], Infectious Disease MD. Continue until OP follow up with him in 1-2 weeks. 08/11/12   Elease Etienne, MD  ibuprofen (ADVIL,MOTRIN) 400 MG tablet Take 1 tablet (400 mg total) by mouth every 6 (six) hours as needed. 09/30/15   Marily Memos, MD  oxyCODONE-acetaminophen (PERCOCET) 5-325 MG tablet Take 1 tablet by mouth every 12 (twelve) hours as needed. 09/30/15   Marily Memos, MD  potassium chloride SA (K-DUR,KLOR-CON) 20 MEQ tablet Take 2 tablets (40 mEq total) by mouth daily. 08/11/12   Elease Etienne, MD    Family History Family History  Problem Relation Age of Onset  . CVA Mother   . Stroke Mother   . Heart failure Father     Social History Social History  Substance Use Topics  . Smoking status: Former Smoker    Years: 20.00    Types: Cigarettes    Quit date: 07/28/1978  . Smokeless tobacco: Never Used  . Alcohol use No     Allergies   Aspirin   Review of Systems Review of Systems  Musculoskeletal: Positive for arthralgias and gait problem.     Physical Exam Updated Vital Signs There were no vitals taken for this visit.  Physical Exam  Constitutional: She appears well-developed and well-nourished. No distress.  HENT:  Head: Normocephalic and atraumatic.  Eyes: Conjunctivae are normal.  Cardiovascular: Normal rate.   Pulmonary/Chest: Effort normal.  Abdominal: She exhibits no distension.  Musculoskeletal:       Feet:  Neurological: She is alert.  Skin: Skin is warm and dry.  Psychiatric: She has a normal mood and affect.  Nursing note and vitals reviewed.    ED Treatments / Results  DIAGNOSTIC STUDIES:  Oxygen Saturation is 100% on RA, normal by my interpretation.    COORDINATION OF CARE:  8:25 PM Discussed treatment plan with pt at bedside and pt agreed to plan.   Labs (all labs ordered are listed, but only abnormal results are displayed) Labs Reviewed - No data to display  EKG  EKG Interpretation None       Radiology No results found.  Procedures Procedures (including critical care time)  Medications Ordered in  ED Medications - No data to display   Initial Impression / Assessment and Plan / ED Course  I have reviewed the triage vital signs and the nursing notes.  Pertinent labs & imaging results that were available during my care of the patient were reviewed by me and considered in my medical decision making (see chart for details).  Clinical Course    After reviewing the previous x-rays.  They mentioned an area along the fifth metatarsal and dedicated foot films would be needed.  I ordered these.  The patient has a fracture of what appears to be an old injury site.  She will be referred to orthopedics.  Told to return here as needed.  Patient is placed in a splint  Final Clinical Impressions(s) / ED Diagnoses   Final diagnoses:  None    New Prescriptions New Prescriptions   No medications on file      Charlestine NightChristopher Naleigha Raimondi, PA-C 10/06/15 2204    Canary Brimhristopher J Tegeler, MD 10/07/15 1212

## 2015-10-10 ENCOUNTER — Encounter: Payer: Self-pay | Admitting: Family Medicine

## 2015-10-10 ENCOUNTER — Ambulatory Visit (INDEPENDENT_AMBULATORY_CARE_PROVIDER_SITE_OTHER): Payer: Medicare Other | Admitting: Family Medicine

## 2015-10-10 DIAGNOSIS — S99912A Unspecified injury of left ankle, initial encounter: Secondary | ICD-10-CM | POA: Diagnosis not present

## 2015-10-10 MED ORDER — NITROGLYCERIN 0.2 MG/HR TD PT24: MEDICATED_PATCH | TRANSDERMAL | 1 refills | Status: DC

## 2015-10-10 NOTE — Patient Instructions (Signed)
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 Do the ankle exercises 3 sets of 10 once a day with theraband. Ice or heat (whichever feels better) 15 minutes at a time 3-4 times a day. Wear good, supportive shoes (consider something like dr. Jari Sportsmanscholls active series, superfeet, or something similar). Avoid flat shoes, barefoot walking. Nitro patches 1/4th patch to affected area, change daily. Follow up with me in 6 weeks for reevaluation. We can consider increased nitro dosage, physical therapy, injection if not improving.

## 2015-10-11 DIAGNOSIS — S99912A Unspecified injury of left ankle, initial encounter: Secondary | ICD-10-CM | POA: Insufficient documentation

## 2015-10-11 NOTE — Progress Notes (Signed)
PCP: No PCP Per Patient  Subjective:   HPI: Patient is a 82 y.o. female here for left foot injury.  Patient reports 2 1/2 months ago she slipped on a step and fell with foot underneath her. Pain is 8/10 lateral ankle, sharp. Over 20-30 years ago she broke her foot and healed (believes it was 5th metatarsal). Wore a boot after ED visit 2 weeks ago - switched to a splint after most recent visit. + swelling. No skin changes, numbness  Past Medical History:  Diagnosis Date  . AAA (abdominal aortic aneurysm) (HCC)    4.1 cm on CT 08-17-12  . Arthritis   . Hypertension     Current Outpatient Prescriptions on File Prior to Visit  Medication Sig Dispense Refill  . dextrose 5 % SOLN 50 mL with cefTRIAXone 1 G SOLR 1 g Inject 1 g into the vein daily. Duration of treatment to be determined by Dr. Johny SaxJeffrey Hatcher, Infectious Disease MD. Continue until OP follow up with him in 1-2 weeks.    Marland Kitchen. HYDROcodone-acetaminophen (NORCO/VICODIN) 5-325 MG tablet Take 1 tablet by mouth every 6 (six) hours as needed for moderate pain. 15 tablet 0  . ibuprofen (ADVIL,MOTRIN) 400 MG tablet Take 1 tablet (400 mg total) by mouth every 6 (six) hours as needed. 30 tablet 0  . oxyCODONE-acetaminophen (PERCOCET) 5-325 MG tablet Take 1 tablet by mouth every 12 (twelve) hours as needed. 6 tablet 0  . potassium chloride SA (K-DUR,KLOR-CON) 20 MEQ tablet Take 2 tablets (40 mEq total) by mouth daily. 60 tablet 0   No current facility-administered medications on file prior to visit.     Past Surgical History:  Procedure Laterality Date  . NO PAST SURGERIES      Allergies  Allergen Reactions  . Aspirin Nausea And Vomiting    Social History   Social History  . Marital status: Widowed    Spouse name: N/A  . Number of children: N/A  . Years of education: N/A   Occupational History  . Not on file.   Social History Main Topics  . Smoking status: Former Smoker    Years: 20.00    Types: Cigarettes    Quit  date: 07/28/1978  . Smokeless tobacco: Never Used  . Alcohol use No  . Drug use: No  . Sexual activity: Not on file   Other Topics Concern  . Not on file   Social History Narrative  . No narrative on file    Family History  Problem Relation Age of Onset  . CVA Mother   . Stroke Mother   . Heart failure Father     BP (!) 144/93   Pulse 79   Ht 5\' 3"  (1.6 m)   Wt 125 lb (56.7 kg)   BMI 22.14 kg/m   Review of Systems: See HPI above.    Objective:  Physical Exam:  Gen: NAD, comfortable in exam room  Left foot/ankle: No gross deformity, swelling, ecchymoses FROM with pain on external rotation TTP over peroneal tendons as they cross the ankle.  No tenderness 5th metatarsal. Negative ant drawer and talar tilt.   Negative syndesmotic compression. Thompsons test negative. NV intact distally.  Right foot/ankle: FROM without pain.    MSK u/s left foot and ankle:  No abnormalities of lateral malleolus, peroneal tendons (slight fluid closer to insertion surrounding tendon but no tears).  Fracture line visible of 5th metatarsal base but no neovascularity.  Assessment & Plan:  1. Left foot/ankle injury -  Independently reviewed radiographs - she does have evidence of 5th metatarsal fracture but clinically has no tenderness here, known remote fracture here.  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  Current pain is due to peroneal tendinitis.  She will start with home rehab exercises, arch support, ice/heat, nitro patches.  F/u in 6 weeks.  Consider PT, injection, increased nitro dosage if not improving.

## 2015-10-11 NOTE — Assessment & Plan Note (Signed)
Independently reviewed radiographs - she does have evidence of 5th metatarsal fracture but clinically has no tenderness here, known remote fracture here.  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  Current pain is due to peroneal tendinitis.  She will start with home rehab exercises, arch support, ice/heat, nitro patches.  F/u in 6 weeks.  Consider PT, injection, increased nitro dosage if not improving.

## 2015-10-13 ENCOUNTER — Telehealth: Payer: Self-pay | Admitting: Family Medicine

## 2015-10-13 NOTE — Telephone Encounter (Signed)
This is going to take more than a few days to get better.  We could give her a short course of a pain medication like hydrocodone (but cannot refill) in addition to what she is doing but she would have to pick this up.

## 2015-10-13 NOTE — Telephone Encounter (Signed)
Spoke to daughter in law (have DPR) and gave her information provided by physician. Stated she would contact patient and would call back. Did not receive callback.

## 2015-10-17 NOTE — Telephone Encounter (Signed)
Still have not received a call back.

## 2015-10-20 ENCOUNTER — Emergency Department (HOSPITAL_COMMUNITY)
Admission: EM | Admit: 2015-10-20 | Discharge: 2015-10-20 | Disposition: A | Discharge status: Home / Self Care | Payer: Medicare Other | Attending: Emergency Medicine | Admitting: Emergency Medicine

## 2015-10-20 ENCOUNTER — Encounter (HOSPITAL_COMMUNITY): Payer: Self-pay

## 2015-10-20 DIAGNOSIS — Z87891 Personal history of nicotine dependence: Secondary | ICD-10-CM | POA: Insufficient documentation

## 2015-10-20 DIAGNOSIS — M7750 Other enthesopathy of unspecified foot: Secondary | ICD-10-CM

## 2015-10-20 DIAGNOSIS — M79672 Pain in left foot: Secondary | ICD-10-CM | POA: Diagnosis present

## 2015-10-20 DIAGNOSIS — M65272 Calcific tendinitis, left ankle and foot: Secondary | ICD-10-CM | POA: Diagnosis not present

## 2015-10-20 DIAGNOSIS — I10 Essential (primary) hypertension: Secondary | ICD-10-CM | POA: Diagnosis not present

## 2015-10-20 MED ORDER — OXYCODONE-ACETAMINOPHEN 5-325 MG PO TABS
1.0000 | ORAL_TABLET | Freq: Once | ORAL | Status: AC
  Administered 2015-10-20: 1 via ORAL
  Filled 2015-10-20: qty 1

## 2015-10-20 MED ORDER — OXYCODONE-ACETAMINOPHEN 5-325 MG PO TABS: 1.0000 | ORAL_TABLET | Freq: Three times a day (TID) | ORAL | 0 refills | Status: DC | PRN

## 2015-10-20 NOTE — ED Provider Notes (Signed)
[facility]PT Provider Note   CSN: [id number] Arrival date & time: 10/20/15  4098     History   Chief Complaint Chief Complaint  Patient presents with  . Foot Pain    HPI Sydney Scott is a 82 y.o. female.  The history is provided by the patient and medical records. No language interpreter was used.  Foot Pain  Pertinent negatives include no abdominal pain and no shortness of breath.   Sydney Scott is a 82 y.o. female  with a PMH of HTN, arthritis, AAA who presents to the Emergency Department complaining of persistent left lateral foot pain for the last 3-4 months. Pain is worse with ambulation. Patient states pain started initially when she had a fall, but has not improved despite ice/heat/elevation and boot. Pain medication provides temporary relief, but no improvement in symptoms overall. She followed up with Dr. Jamey Ripa, sports medicine on 9/26 where she was told it was 2/2 peroneal tendinitis. She has been doing exercises he taught her with no relief. Initially had swelling but this has since resolved. No weakness, numbness, erythema, fever or other associated symptoms.    Past Medical History:  Diagnosis Date  . AAA (abdominal aortic aneurysm) (HCC)    4.1 cm on CT 08-17-12  . Arthritis   . Hypertension     Patient Active Problem List   Diagnosis Date Noted  . Left ankle injury 10/11/2015  . HTN (hypertension) 08/26/2012  . AAA (abdominal aortic aneurysm) without rupture (HCC) 08/26/2012  . Liver abscess 08/06/2012  . Bacteremia 08/02/2012  . Weakness 07/27/2012  . UTI (urinary tract infection) 07/27/2012  . Leukocytosis, unspecified 07/27/2012  . Anemia 07/27/2012  . Hypokalemia 07/27/2012    Past Surgical History:  Procedure Laterality Date  . NO PAST SURGERIES      OB History    No data available       Home Medications    Prior to Admission medications   Medication Sig Start Date End Date Taking? Authorizing Provider  ibuprofen  (ADVIL,MOTRIN) 400 MG tablet Take 1 tablet (400 mg total) by mouth every 6 (six) hours as needed. Patient not taking: Reported on 10/20/2015 09/30/15   Marily Memos, MD  nitroGLYCERIN (NITRODUR - DOSED IN MG/24 HR) 0.2 mg/hr patch Apply 1/4th patch to affected ankle, change daily Patient not taking: Reported on 10/20/2015 10/10/15   Lenda Kelp, MD  oxyCODONE-acetaminophen (PERCOCET/ROXICET) 5-325 MG tablet Take 1 tablet by mouth every 8 (eight) hours as needed for severe pain. 10/20/15   Chase Picket Marygrace Sandoval, PA-C    Family History Family History  Problem Relation Age of Onset  . CVA Mother   . Stroke Mother   . Heart failure Father     Social History Social History  Substance Use Topics  . Smoking status: Former Smoker    Years: 20.00    Types: Cigarettes    Quit date: 07/28/1978  . Smokeless tobacco: Never Used  . Alcohol use No     Allergies   Aspirin   Review of Systems Review of Systems  Constitutional: Negative for fever.  HENT: Negative for congestion.   Eyes: Negative for visual disturbance.  Respiratory: Negative for shortness of breath.   Cardiovascular: Negative.   Gastrointestinal: Negative for abdominal pain.  Genitourinary: Negative for dysuria.  Musculoskeletal: Positive for arthralgias and myalgias.  Skin: Negative for rash.  Neurological: Negative for weakness and numbness.     Physical Exam Updated Vital Signs BP (!) 151/123 (BP Location: Left Arm)  Pulse 89   Temp 98 F (36.7 C) (Oral)   Resp 18   Ht 5\' 3"  (1.6 m)   Wt 56.7 kg   SpO2 100%   BMI 22.14 kg/m   Physical Exam  Constitutional: She is oriented to person, place, and time. She appears well-developed and well-nourished. No distress.  HENT:  Head: Normocephalic and atraumatic.  Cardiovascular: Normal rate, regular rhythm, normal heart sounds and intact distal pulses.   Pulmonary/Chest: Effort normal and breath sounds normal. No respiratory distress.  Abdominal: Soft. She exhibits  no distension. There is no tenderness.  Musculoskeletal:  Left foot/ankle: No gross deformity noted. Patient has full ROM without pain. There is no joint effusion noted. No erythema or warmth overlaying the joint. There is tenderness to palpation over the lateral forefoot. No malleolar tenderness. 2+ DP pulses, sensation intact to medial, lateral, dorsal and plantar aspects.  Neurological: She is alert and oriented to person, place, and time.  Skin: Skin is warm and dry.  Nursing note and vitals reviewed.    ED Treatments / Results  Labs (all labs ordered are listed, but only abnormal results are displayed) Labs Reviewed - No data to display  EKG  EKG Interpretation None       Radiology No results found.  Procedures Procedures (including critical care time)  Medications Ordered in ED Medications  oxyCODONE-acetaminophen (PERCOCET/ROXICET) 5-325 MG per tablet 1 tablet (not administered)     Initial Impression / Assessment and Plan / ED Course  I have reviewed the triage vital signs and the nursing notes.  Pertinent labs & imaging results that were available during my care of the patient were reviewed by me and considered in my medical decision making (see chart for details).  Clinical Course   Burnell BlanksConnie Erisman presents with unchanged left foot pain x 4 months. This is her 3rd ER visit for the same and she has also followed up with sports medicine. Sports medicine believes pain is 2/2 peroneal tendinitis and today's examination is c/w this diagnosis as well. No new injuries. I do not feel that imaging is warranted at this time. Rx for pain meds and patient encouraged to follow up with orthopedics. Patient has boot at home already. All questions answered.   Patient seen by and discussed with Dr. Rush Landmarkegeler who agrees with treatment plan.    Final Clinical Impressions(s) / ED Diagnoses   Final diagnoses:  Tendonitis of ankle or foot    New Prescriptions New Prescriptions    OXYCODONE-ACETAMINOPHEN (PERCOCET/ROXICET) 5-325 MG TABLET    Take 1 tablet by mouth every 8 (eight) hours as needed for severe pain.     Chase PicketJaime Pilcher Petro Talent, PA-C 10/20/15 1205    Canary Brimhristopher J Tegeler, MD 10/20/15 2111

## 2015-10-20 NOTE — Discharge Instructions (Signed)
Please follow up with your orthopedic physician for discussion of today's visit.  Return to ER for new or worsening symptoms, any additional concerns.

## 2015-10-20 NOTE — ED Triage Notes (Addendum)
Pt presents to ED c/o L foot pain and swelling x 4-5 weeks. Pt reports she injured her foot ~1 month ago when she "slipped on a step". Per Pt's family, Pt has been seen multiple times for the same complaint but has not had relief. Pt's family reports pt has "seen a specialist but that didn't help at all". Pulses 2+ bilaterally, sensation and mobility intact, cap refill < 3 seconds, negative Homan's sign. Mild swelling noted

## 2015-10-26 ENCOUNTER — Ambulatory Visit (INDEPENDENT_AMBULATORY_CARE_PROVIDER_SITE_OTHER): Payer: Medicare Other | Admitting: Family Medicine

## 2015-10-26 VITALS — BP 127/92 | HR 96 | Ht 63.0 in | Wt 125.0 lb

## 2015-10-26 DIAGNOSIS — M25572 Pain in left ankle and joints of left foot: Secondary | ICD-10-CM | POA: Diagnosis not present

## 2015-10-26 MED ORDER — OXYCODONE-ACETAMINOPHEN 10-325 MG PO TABS: 1.0000 | ORAL_TABLET | Freq: Four times a day (QID) | ORAL | 0 refills | Status: DC | PRN

## 2015-10-26 NOTE — Patient Instructions (Addendum)
You were given an IM injection of depomedrol today (a steroid). We will go ahead with MRIs of your foot and ankle given the persistent level of pain not responding to conservative treatment. This prescription for oxycodone is a higher strength than what you got in the emergency department - take only as needed.

## 2015-10-27 ENCOUNTER — Encounter: Payer: Self-pay | Admitting: Family Medicine

## 2015-10-27 DIAGNOSIS — M25572 Pain in left ankle and joints of left foot: Secondary | ICD-10-CM | POA: Diagnosis not present

## 2015-10-27 MED ORDER — METHYLPREDNISOLONE ACETATE 40 MG/ML IJ SUSP
40.0000 mg | Freq: Once | INTRAMUSCULAR | Status: AC
  Administered 2015-10-27: 40 mg via INTRAMUSCULAR

## 2015-10-28 ENCOUNTER — Ambulatory Visit (HOSPITAL_BASED_OUTPATIENT_CLINIC_OR_DEPARTMENT_OTHER)
Admission: RE | Admit: 2015-10-28 | Discharge: 2015-10-28 | Disposition: A | Discharge status: Home / Self Care | Payer: Medicare Other | Source: Ambulatory Visit | Attending: Family Medicine | Admitting: Family Medicine

## 2015-10-28 DIAGNOSIS — M25572 Pain in left ankle and joints of left foot: Secondary | ICD-10-CM | POA: Insufficient documentation

## 2015-10-28 DIAGNOSIS — X58XXXA Exposure to other specified factors, initial encounter: Secondary | ICD-10-CM | POA: Diagnosis not present

## 2015-10-28 DIAGNOSIS — S92352A Displaced fracture of fifth metatarsal bone, left foot, initial encounter for closed fracture: Secondary | ICD-10-CM | POA: Diagnosis not present

## 2015-10-30 ENCOUNTER — Telehealth: Payer: Self-pay | Admitting: Family Medicine

## 2015-10-30 NOTE — Telephone Encounter (Signed)
Spoke with her and provided results.  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  There is edema on both sides of the fracture.  We discussed it is unusual that majority of her pain is within peroneal tendons though so would like to get their opinion instead of going forward with peroneal tendon sheath injection.

## 2015-10-31 NOTE — Progress Notes (Signed)
PCP: No PCP Per Patient  Subjective:   HPI: Patient is a 82 y.o. female here for left foot injury.  9/26: Patient reports 2 1/2 months ago she slipped on a step and fell with foot underneath her. Pain is 8/10 lateral ankle, sharp. Over 20-30 years ago she broke her foot and healed (believes it was 5th metatarsal). Wore a boot after ED visit 2 weeks ago - switched to a splint after most recent visit. + swelling. No skin changes, numbness  10/12: Patient reports pain is still very severe, 10/10 level lateral left ankle and foot. Pain is sharp. Not improving with pain medication, HEP, ice/heat, arch support, and nitro patches to date. No skin changes, numbness.  Past Medical History:  Diagnosis Date  . AAA (abdominal aortic aneurysm) (HCC)    4.1 cm on CT 08-17-12  . Arthritis   . Hypertension     Current Outpatient Prescriptions on File Prior to Visit  Medication Sig Dispense Refill  . ibuprofen (ADVIL,MOTRIN) 400 MG tablet Take 1 tablet (400 mg total) by mouth every 6 (six) hours as needed. (Patient not taking: Reported on 10/20/2015) 30 tablet 0  . nitroGLYCERIN (NITRODUR - DOSED IN MG/24 HR) 0.2 mg/hr patch Apply 1/4th patch to affected ankle, change daily (Patient not taking: Reported on 10/20/2015) 30 patch 1   No current facility-administered medications on file prior to visit.     Past Surgical History:  Procedure Laterality Date  . NO PAST SURGERIES      Allergies  Allergen Reactions  . Aspirin Nausea And Vomiting    Social History   Social History  . Marital status: Widowed    Spouse name: N/A  . Number of children: N/A  . Years of education: N/A   Occupational History  . Not on file.   Social History Main Topics  . Smoking status: Former Smoker    Years: 20.00    Types: Cigarettes    Quit date: 07/28/1978  . Smokeless tobacco: Never Used  . Alcohol use No  . Drug use: No  . Sexual activity: Not on file   Other Topics Concern  . Not on file    Social History Narrative  . No narrative on file    Family History  Problem Relation Age of Onset  . CVA Mother   . Stroke Mother   . Heart failure Father     BP (!) 127/92   Pulse 96   Ht 5\' 3"  (1.6 m)   Wt 125 lb (56.7 kg)   BMI 22.14 kg/m   Review of Systems: See HPI above.    Objective:  Physical Exam:  Gen: NAD, comfortable in exam room  Left foot/ankle: No gross deformity, swelling, ecchymoses FROM with pain on external rotation TTP over peroneal tendons as they cross the ankle, mild tenderness today base 5th metatarsal but less than over peroneals. Negative ant drawer and talar tilt.   Negative syndesmotic compression. Thompsons test negative. NV intact distally.  Right foot/ankle: FROM without pain.  Assessment & Plan:  1. Left foot/ankle injury - Independently reviewed radiographs previously and performed bedside MSK u/s - no neovascularity but does have nonunion base 5th metatarsal.  Most of pain and tenderness is about the ankle however - wouldn't expect pain to be this severe with a peroneal tendinitis.  Has been ongoing now for 3 months - will go ahead with MRIs to further assess for bony lesion, other cause of severe pain of the lateral foot or  ankle.  In meantime continue home rehab exercises, arch support, ice/heat, nitro patches.  IM depomodrol given as well.  Oxycodone for severe pain.  Addendum:  MRI reviewed and discussed with patient's daughter.  Only abnormal finding is the fracture at base of the 5th metatarsal, nonunion but visible edema on both sides of the fracture.  No peroneal tendon abnormalities or bony lesions.  Is possible she's just getting referred pain up the ankle and true pain is at this nonunion where she may have suffered an acute fracture at site of the nonunion despite very little tenderness here.  Discussed options - will go ahead with orthopedic referral to get their recommendations about possible ORIF, casting, possible peroneal  tendon sheath injection.

## 2015-11-01 ENCOUNTER — Telehealth: Payer: Self-pay | Admitting: Family Medicine

## 2015-11-01 NOTE — Telephone Encounter (Signed)
Noted change of appointment.

## 2015-11-02 ENCOUNTER — Ambulatory Visit (INDEPENDENT_AMBULATORY_CARE_PROVIDER_SITE_OTHER): Payer: Self-pay | Admitting: Orthopedic Surgery

## 2015-11-14 ENCOUNTER — Telehealth: Payer: Self-pay | Admitting: Family Medicine

## 2015-11-14 MED ORDER — OXYCODONE-ACETAMINOPHEN 10-325 MG PO TABS: 1.0000 | ORAL_TABLET | Freq: Four times a day (QID) | ORAL | 0 refills | Status: DC | PRN

## 2015-11-14 NOTE — Telephone Encounter (Signed)
I believe she means the Percocet.  Her appointment is November 10th.  I've printed a new script for this.

## 2015-11-16 NOTE — Telephone Encounter (Signed)
Script picked up.

## 2015-11-21 ENCOUNTER — Ambulatory Visit: Payer: Medicare Other | Admitting: Family Medicine

## 2015-11-24 ENCOUNTER — Encounter (INDEPENDENT_AMBULATORY_CARE_PROVIDER_SITE_OTHER): Payer: Self-pay | Admitting: Orthopedic Surgery

## 2015-11-24 ENCOUNTER — Ambulatory Visit (INDEPENDENT_AMBULATORY_CARE_PROVIDER_SITE_OTHER): Payer: Medicare Other | Admitting: Orthopedic Surgery

## 2015-11-24 VITALS — Ht 63.0 in | Wt 125.0 lb

## 2015-11-24 DIAGNOSIS — M84375K Stress fracture, left foot, subsequent encounter for fracture with nonunion: Secondary | ICD-10-CM

## 2015-11-24 NOTE — Progress Notes (Signed)
Office Visit Note   Patient: Sydney Scott           Date of Birth: 03/12/1927           MRN: [id number] Visit Date: 11/24/2015              Requested by: No referring provider defined for this encounter. PCP: No PCP Per Patient  Referred by Dr. Norton BlizzardShane Hudnall   Assessment & Plan: Visit Diagnoses:  1. 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 Most recently she fell 2-3 months ago and has been having increasing pain she has tried fracture boot casting without relief she has been to the hospital 6 times MRI scan of the foot and ankle shows nonunion of base of fifth metatarsal plan for open reduction internal fixation risks and benefits were discussed patient states she understands wish to proceed at this time.  Follow-Up Instructions: Return in about 2 weeks (around 12/08/2015).   Orders:  No orders of the defined types were placed in this encounter.  No orders of the defined types were placed in this encounter.     Procedures: No procedures performed   Clinical Data: No additional findings.   Subjective: Chief Complaint  Patient presents with  . Left Foot - Pain, Injury    DOI roughly 3 months ago slip and fall down steps    Patient is s/p a fall about three months ago. Pt states that she has been in and out of the hospital about 6 times for her foot. S/p MRI on 10/28/15. She is full weight bearing and in crocks.   Injury    Patient states she fractured the fifth metatarsal years ago but has only been symptomatic for the past 3 months after an acute fall. Review of Systems   Objective: Vital Signs: Ht 5\' 3"  (1.6 m)   Wt 125 lb (56.7 kg)   BMI 22.14 kg/m   Physical Exam on examination patient is alert oriented no adenopathy well-dressed normal affect normal respiratory effort she has an antalgic gait negative history for tobacco or diabetes. Examination she has a  good dorsalis pedis pulse and posterior tibial pulse she is point tender over the base of the fifth metatarsal. The remainder of her foot exam is normal. Radiographs shows a nonunion fracture of the base of the fifth metatarsal metaphyseal fracture or I scans are reviewed which also shows shows a nonunion base of the fifth metatarsal left foot.  Ortho Exam  Specialty Comments:  No specialty comments available.  Imaging: No results found.   PMFS History: Patient Active Problem List   Diagnosis Date Noted  . Left ankle injury 10/11/2015  . HTN (hypertension) 08/26/2012  . AAA (abdominal aortic aneurysm) without rupture (HCC) 08/26/2012  . Liver abscess 08/06/2012  . Bacteremia 08/02/2012  . Weakness 07/27/2012  . UTI (urinary tract infection) 07/27/2012  . Leukocytosis, unspecified 07/27/2012  . Anemia 07/27/2012  . Hypokalemia 07/27/2012   Past Medical History:  Diagnosis Date  . AAA (abdominal aortic aneurysm) (HCC)    4.1 cm on CT 08-17-12  . Arthritis   . Hypertension     Family History  Problem Relation Age of Onset  . CVA Mother   . Stroke Mother   . Heart failure Father     Past Surgical History:  Procedure Laterality Date  . NO PAST SURGERIES  Social History   Occupational History  . Not on file.   Social History Main Topics  . Smoking status: Former Smoker    Years: 20.00    Types: Cigarettes    Quit date: 07/28/1978  . Smokeless tobacco: Never Used  . Alcohol use No  . Drug use: No  . Sexual activity: Not on file

## 2015-11-27 ENCOUNTER — Telehealth (INDEPENDENT_AMBULATORY_CARE_PROVIDER_SITE_OTHER): Payer: Self-pay | Admitting: *Deleted

## 2015-11-27 ENCOUNTER — Telehealth: Payer: Self-pay | Admitting: Family Medicine

## 2015-11-27 NOTE — Telephone Encounter (Signed)
Pt Called back about message below  

## 2015-11-27 NOTE — Telephone Encounter (Signed)
Daughter in law Sydney NixonJanice stating after pt surgery on 11/28 she would like to be in a facility for 30 days. [facility] is [name] [place]. Call back number is [phone]

## 2015-11-28 NOTE — Telephone Encounter (Signed)
Had questions about sending to rehab after surgery.Told her that the surgeon would be one to set up rehab.

## 2015-11-28 NOTE — Telephone Encounter (Signed)
I spoke with pt's daughter in law. Advised that Sydney Scott is having outpatient surgery at the surgical center and could not be admitted to a rehab facility unless she was admitted for 3 days and followed the protocol.  Her surgery does not require her to be admitted. Advised they could pay for private duty nursing or pay out of pocket for rehab, but it is my understanding that you cannot be admitted to rehab facility after outpatient surgery.

## 2015-12-05 ENCOUNTER — Other Ambulatory Visit (INDEPENDENT_AMBULATORY_CARE_PROVIDER_SITE_OTHER): Payer: Self-pay | Admitting: Family

## 2015-12-15 ENCOUNTER — Telehealth (INDEPENDENT_AMBULATORY_CARE_PROVIDER_SITE_OTHER): Payer: Self-pay | Admitting: Orthopedic Surgery

## 2015-12-15 ENCOUNTER — Telehealth (INDEPENDENT_AMBULATORY_CARE_PROVIDER_SITE_OTHER): Payer: Self-pay | Admitting: *Deleted

## 2015-12-15 ENCOUNTER — Other Ambulatory Visit (INDEPENDENT_AMBULATORY_CARE_PROVIDER_SITE_OTHER): Payer: Self-pay | Admitting: Family

## 2015-12-15 ENCOUNTER — Telehealth: Payer: Self-pay | Admitting: Family Medicine

## 2015-12-15 MED ORDER — OXYCODONE-ACETAMINOPHEN 10-325 MG PO TABS: 1.0000 | ORAL_TABLET | Freq: Four times a day (QID) | ORAL | 0 refills | Status: AC | PRN

## 2015-12-15 NOTE — Telephone Encounter (Signed)
Advised to contact the surgeon.

## 2015-12-15 NOTE — Telephone Encounter (Signed)
Pt is a non union 5th MT fx sch for surgery on 12/20/15 and is requesting refill on pain medication.

## 2015-12-15 NOTE — Telephone Encounter (Signed)
Called and lm on vm to advise that rx is ready for pick up 

## 2015-12-15 NOTE — Telephone Encounter (Signed)
Ok, call [facility]

## 2015-12-15 NOTE — Telephone Encounter (Signed)
Got verbal order for pts son to pick up RX 12/15/2015

## 2015-12-15 NOTE — Telephone Encounter (Signed)
They should contact the surgeon.

## 2015-12-15 NOTE — Telephone Encounter (Signed)
Patient requesting a few more pain pills to last until her surgery on Wednesday. Please call Liborio NixonJanice when ready for pick up.

## 2015-12-19 ENCOUNTER — Encounter (HOSPITAL_COMMUNITY): Payer: Self-pay | Admitting: *Deleted

## 2015-12-19 NOTE — Progress Notes (Signed)
Anesthesia Chart Review:  Pt is a same day work up.   Pt is an 82 year old female scheduled for L 5th metatarsal internal fixation on 12/20/2015 with Aldean BakerMarcus Duda, MD.   PMH includes:  AAA (4.1cm by CT 08/2012), HTN. Former smoker. BMI 22  Medications reviewed.   Labs will be obtained DOS.   EKG will be obtained DOS.   Pt does not have a PCP. When she was hospitalized July 2014 for HCAP and liver abscess, she was found to have a AAA of 4.1cm.  In phone call with PAT RN, pt was unaware of AAA and has not had follow up for it.    Pt will need further assessment by her assigned anesthesiologist DOS.   Rica Mastngela Eriberto Felch, FNP-BC [facility]Anesthesiology Phone: [phone] 12/19/2015 4:34 PM

## 2015-12-19 NOTE — Progress Notes (Signed)
I spoke with Sydney GrimeJanice Contino-, Sydney Scott, who is a designated person to speak to regarding Sydney Scott.  Sydney GrimeJanice Scott reports that patient does not have a PCP, and has not seen a medical doctor or Cardiologist since 2014 hospitalization. since discharge in 2014.  I asked Sydney Scott if she was instructed to have her to follow up regarding AAA, she said no. Sydney Scott reports that patient has had elevated blood pressure  when checked in the ED , 140/90's " She (Sydney Scott ) reminds me that she needs to see a doctor, but when have been so busy with the fracture that we have not been able to make an appointment. Sydney Scott wants to see a SW while patient is here, " there is no one at home during the day to assist Sydney Scott as she recovers."

## 2015-12-20 ENCOUNTER — Observation Stay (HOSPITAL_COMMUNITY)
Admission: RE | Admit: 2015-12-20 | Discharge: 2015-12-22 | Disposition: A | Discharge status: Skilled Nursing Facility | Payer: Medicare Other | Source: Ambulatory Visit | Attending: Orthopedic Surgery | Admitting: Orthopedic Surgery

## 2015-12-20 ENCOUNTER — Encounter (HOSPITAL_COMMUNITY)
Admission: RE | Disposition: A | Discharge status: Skilled Nursing Facility | Payer: Self-pay | Source: Ambulatory Visit | Attending: Orthopedic Surgery

## 2015-12-20 ENCOUNTER — Encounter (HOSPITAL_COMMUNITY): Payer: Self-pay | Admitting: Urology

## 2015-12-20 ENCOUNTER — Ambulatory Visit (HOSPITAL_COMMUNITY): Payer: Medicare Other | Admitting: Emergency Medicine

## 2015-12-20 DIAGNOSIS — M1990 Unspecified osteoarthritis, unspecified site: Secondary | ICD-10-CM | POA: Insufficient documentation

## 2015-12-20 DIAGNOSIS — Z87891 Personal history of nicotine dependence: Secondary | ICD-10-CM | POA: Diagnosis not present

## 2015-12-20 DIAGNOSIS — Z8249 Family history of ischemic heart disease and other diseases of the circulatory system: Secondary | ICD-10-CM | POA: Diagnosis not present

## 2015-12-20 DIAGNOSIS — I10 Essential (primary) hypertension: Secondary | ICD-10-CM | POA: Insufficient documentation

## 2015-12-20 DIAGNOSIS — M84375K Stress fracture, left foot, subsequent encounter for fracture with nonunion: Secondary | ICD-10-CM | POA: Diagnosis not present

## 2015-12-20 DIAGNOSIS — R262 Difficulty in walking, not elsewhere classified: Secondary | ICD-10-CM

## 2015-12-20 DIAGNOSIS — D649 Anemia, unspecified: Secondary | ICD-10-CM | POA: Diagnosis not present

## 2015-12-20 DIAGNOSIS — Z823 Family history of stroke: Secondary | ICD-10-CM | POA: Diagnosis not present

## 2015-12-20 DIAGNOSIS — I714 Abdominal aortic aneurysm, without rupture: Secondary | ICD-10-CM | POA: Diagnosis not present

## 2015-12-20 DIAGNOSIS — Z886 Allergy status to analgesic agent status: Secondary | ICD-10-CM | POA: Insufficient documentation

## 2015-12-20 DIAGNOSIS — S92352K Displaced fracture of fifth metatarsal bone, left foot, subsequent encounter for fracture with nonunion: Secondary | ICD-10-CM | POA: Diagnosis present

## 2015-12-20 DIAGNOSIS — X58XXXD Exposure to other specified factors, subsequent encounter: Secondary | ICD-10-CM | POA: Insufficient documentation

## 2015-12-20 HISTORY — DX: Pneumonia, unspecified organism: J18.9

## 2015-12-20 HISTORY — PX: ORIF TOE FRACTURE: SHX5032

## 2015-12-20 LAB — CBC
HCT: 39.1 % (ref 36.0–46.0)
Hemoglobin: 13.8 g/dL (ref 12.0–15.0)
MCH: 29.9 pg (ref 26.0–34.0)
MCHC: 35.3 g/dL (ref 30.0–36.0)
MCV: 84.8 fL (ref 78.0–100.0)
PLATELETS: 183 10*3/uL (ref 150–400)
RBC: 4.61 MIL/uL (ref 3.87–5.11)
RDW: 15.8 % — AB (ref 11.5–15.5)
WBC: 6.7 10*3/uL (ref 4.0–10.5)

## 2015-12-20 LAB — BASIC METABOLIC PANEL
ANION GAP: 7 (ref 5–15)
BUN: 11 mg/dL (ref 6–20)
CALCIUM: 9.1 mg/dL (ref 8.9–10.3)
CO2: 26 mmol/L (ref 22–32)
Chloride: 107 mmol/L (ref 101–111)
Creatinine, Ser: 0.76 mg/dL (ref 0.44–1.00)
GLUCOSE: 98 mg/dL (ref 65–99)
POTASSIUM: 3.8 mmol/L (ref 3.5–5.1)
SODIUM: 140 mmol/L (ref 135–145)

## 2015-12-20 SURGERY — OPEN REDUCTION INTERNAL FIXATION (ORIF) METATARSAL (TOE) FRACTURE
Anesthesia: Monitor Anesthesia Care | Site: Foot | Laterality: Left

## 2015-12-20 MED ORDER — MIDAZOLAM HCL 2 MG/2ML IJ SOLN
INTRAMUSCULAR | Status: AC
  Administered 2015-12-20: 1 mg via INTRAVENOUS
  Filled 2015-12-20: qty 2

## 2015-12-20 MED ORDER — CHLORHEXIDINE GLUCONATE 4 % EX LIQD: 60.0000 mL | Freq: Once | CUTANEOUS | Status: DC

## 2015-12-20 MED ORDER — METHOCARBAMOL 1000 MG/10ML IJ SOLN
500.0000 mg | Freq: Four times a day (QID) | INTRAMUSCULAR | Status: DC | PRN
  Filled 2015-12-20: qty 5

## 2015-12-20 MED ORDER — OXYCODONE HCL 5 MG/5ML PO SOLN: 5.0000 mg | Freq: Once | ORAL | Status: DC | PRN

## 2015-12-20 MED ORDER — LIDOCAINE HCL (CARDIAC) 20 MG/ML IV SOLN
INTRAVENOUS | Status: DC | PRN
  Administered 2015-12-20: 40 mg via INTRAVENOUS

## 2015-12-20 MED ORDER — HYDROMORPHONE HCL 2 MG/ML IJ SOLN: 1.0000 mg | INTRAMUSCULAR | Status: DC | PRN

## 2015-12-20 MED ORDER — 0.9 % SODIUM CHLORIDE (POUR BTL) OPTIME
TOPICAL | Status: DC | PRN
  Administered 2015-12-20: 1000 mL

## 2015-12-20 MED ORDER — FENTANYL CITRATE (PF) 100 MCG/2ML IJ SOLN
INTRAMUSCULAR | Status: DC | PRN
  Administered 2015-12-20 (×2): 50 ug via INTRAVENOUS

## 2015-12-20 MED ORDER — PROPOFOL 10 MG/ML IV BOLUS
INTRAVENOUS | Status: DC | PRN
  Administered 2015-12-20: 20 mg via INTRAVENOUS

## 2015-12-20 MED ORDER — FENTANYL CITRATE (PF) 100 MCG/2ML IJ SOLN: 25.0000 ug | INTRAMUSCULAR | Status: DC | PRN

## 2015-12-20 MED ORDER — ONDANSETRON HCL 4 MG/2ML IJ SOLN: 4.0000 mg | Freq: Four times a day (QID) | INTRAMUSCULAR | Status: DC | PRN

## 2015-12-20 MED ORDER — MIDAZOLAM HCL 2 MG/2ML IJ SOLN
1.0000 mg | Freq: Once | INTRAMUSCULAR | Status: AC
  Administered 2015-12-20: 1 mg via INTRAVENOUS

## 2015-12-20 MED ORDER — METHOCARBAMOL 500 MG PO TABS: 500.0000 mg | ORAL_TABLET | Freq: Four times a day (QID) | ORAL | Status: DC | PRN

## 2015-12-20 MED ORDER — BUPIVACAINE HCL (PF) 0.5 % IJ SOLN
INTRAMUSCULAR | Status: DC | PRN
  Administered 2015-12-20: 25 mL via PERINEURAL

## 2015-12-20 MED ORDER — OXYCODONE HCL 5 MG PO TABS
5.0000 mg | ORAL_TABLET | ORAL | Status: DC | PRN
  Administered 2015-12-21: 10 mg via ORAL
  Filled 2015-12-20 (×2): qty 2

## 2015-12-20 MED ORDER — OXYCODONE HCL 5 MG PO TABS: 5.0000 mg | ORAL_TABLET | Freq: Once | ORAL | Status: DC | PRN

## 2015-12-20 MED ORDER — FENTANYL CITRATE (PF) 100 MCG/2ML IJ SOLN
INTRAMUSCULAR | Status: AC
  Administered 2015-12-20: 50 ug via INTRAVENOUS
  Filled 2015-12-20: qty 2

## 2015-12-20 MED ORDER — METOCLOPRAMIDE HCL 5 MG/ML IJ SOLN: 5.0000 mg | Freq: Three times a day (TID) | INTRAMUSCULAR | Status: DC | PRN

## 2015-12-20 MED ORDER — METOCLOPRAMIDE HCL 5 MG PO TABS
5.0000 mg | ORAL_TABLET | Freq: Three times a day (TID) | ORAL | Status: DC | PRN
Start: 2015-12-20 — End: 2015-12-22

## 2015-12-20 MED ORDER — ACETAMINOPHEN 325 MG PO TABS: 650.0000 mg | ORAL_TABLET | Freq: Four times a day (QID) | ORAL | Status: DC | PRN

## 2015-12-20 MED ORDER — ONDANSETRON HCL 4 MG PO TABS: 4.0000 mg | ORAL_TABLET | Freq: Four times a day (QID) | ORAL | Status: DC | PRN

## 2015-12-20 MED ORDER — SODIUM CHLORIDE 0.9 % IV SOLN
INTRAVENOUS | Status: DC
  Administered 2015-12-20: 16:00:00 via INTRAVENOUS

## 2015-12-20 MED ORDER — LIDOCAINE 2% (20 MG/ML) 5 ML SYRINGE
INTRAMUSCULAR | Status: AC
  Filled 2015-12-20: qty 5

## 2015-12-20 MED ORDER — ASPIRIN EC 325 MG PO TBEC
325.0000 mg | DELAYED_RELEASE_TABLET | Freq: Every day | ORAL | Status: DC
  Filled 2015-12-20 (×2): qty 1

## 2015-12-20 MED ORDER — LACTATED RINGERS IV SOLN
INTRAVENOUS | Status: DC | PRN
  Administered 2015-12-20: 10:00:00 via INTRAVENOUS

## 2015-12-20 MED ORDER — ONDANSETRON HCL 4 MG/2ML IJ SOLN
INTRAMUSCULAR | Status: DC | PRN
  Administered 2015-12-20: 4 mg via INTRAVENOUS

## 2015-12-20 MED ORDER — CEFAZOLIN SODIUM-DEXTROSE 2-4 GM/100ML-% IV SOLN
2.0000 g | INTRAVENOUS | Status: AC
  Administered 2015-12-20: 2 g via INTRAVENOUS
  Filled 2015-12-20: qty 100

## 2015-12-20 MED ORDER — FENTANYL CITRATE (PF) 100 MCG/2ML IJ SOLN
50.0000 ug | Freq: Once | INTRAMUSCULAR | Status: AC
  Administered 2015-12-20: 50 ug via INTRAVENOUS

## 2015-12-20 MED ORDER — FENTANYL CITRATE (PF) 100 MCG/2ML IJ SOLN
INTRAMUSCULAR | Status: AC
  Filled 2015-12-20: qty 2

## 2015-12-20 MED ORDER — LACTATED RINGERS IV SOLN: INTRAVENOUS | Status: DC

## 2015-12-20 MED ORDER — CEFAZOLIN IN D5W 1 GM/50ML IV SOLN
1.0000 g | Freq: Four times a day (QID) | INTRAVENOUS | Status: AC
  Administered 2015-12-20 – 2015-12-21 (×3): 1 g via INTRAVENOUS
  Filled 2015-12-20 (×3): qty 50

## 2015-12-20 MED ORDER — ACETAMINOPHEN 650 MG RE SUPP: 650.0000 mg | Freq: Four times a day (QID) | RECTAL | Status: DC | PRN

## 2015-12-20 MED ORDER — PROPOFOL 10 MG/ML IV BOLUS
INTRAVENOUS | Status: AC
  Filled 2015-12-20: qty 20

## 2015-12-20 MED ORDER — PROPOFOL 500 MG/50ML IV EMUL
INTRAVENOUS | Status: DC | PRN
  Administered 2015-12-20: 75 ug/kg/min via INTRAVENOUS

## 2015-12-20 SURGICAL SUPPLY — 61 items
1.7MM DRILL BIT ×2 IMPLANT
2.4mmx12mm screw ×6 IMPLANT
BLADE AVERAGE 25X9 (BLADE) IMPLANT
BLADE MINI RND TIP GREEN BEAV (BLADE) IMPLANT
BNDG COHESIVE 1X5 TAN STRL LF (GAUZE/BANDAGES/DRESSINGS) IMPLANT
BNDG COHESIVE 6X5 TAN STRL LF (GAUZE/BANDAGES/DRESSINGS) ×2 IMPLANT
BNDG ESMARK 4X9 LF (GAUZE/BANDAGES/DRESSINGS) ×2 IMPLANT
BNDG GAUZE ELAST 4 BULKY (GAUZE/BANDAGES/DRESSINGS) ×2 IMPLANT
BNDG GAUZE STRTCH 6 (GAUZE/BANDAGES/DRESSINGS) IMPLANT
CORDS BIPOLAR (ELECTRODE) ×2 IMPLANT
COTTON STERILE ROLL (GAUZE/BANDAGES/DRESSINGS) IMPLANT
COVER SURGICAL LIGHT HANDLE (MISCELLANEOUS) ×2 IMPLANT
CUFF TOURNIQUET SINGLE 18IN (TOURNIQUET CUFF) IMPLANT
CUFF TOURNIQUET SINGLE 24IN (TOURNIQUET CUFF) IMPLANT
CUFF TOURNIQUET SINGLE 34IN LL (TOURNIQUET CUFF) IMPLANT
CUFF TOURNIQUET SINGLE 44IN (TOURNIQUET CUFF) IMPLANT
DRAPE OEC MINIVIEW 54X84 (DRAPES) ×2 IMPLANT
DRAPE U-SHAPE 47X51 STRL (DRAPES) ×2 IMPLANT
DRSG ADAPTIC 3X8 NADH LF (GAUZE/BANDAGES/DRESSINGS) ×2 IMPLANT
DRSG PAD ABDOMINAL 8X10 ST (GAUZE/BANDAGES/DRESSINGS) ×2 IMPLANT
DURAPREP 26ML APPLICATOR (WOUND CARE) ×2 IMPLANT
ELECT REM PT RETURN 9FT ADLT (ELECTROSURGICAL) ×2
ELECTRODE REM PT RTRN 9FT ADLT (ELECTROSURGICAL) ×1 IMPLANT
GAUZE SPONGE 2X2 8PLY STRL LF (GAUZE/BANDAGES/DRESSINGS) IMPLANT
GAUZE SPONGE 4X4 12PLY STRL (GAUZE/BANDAGES/DRESSINGS) ×2 IMPLANT
GLOVE BIO SURGEON STRL SZ7.5 (GLOVE) ×2 IMPLANT
GLOVE BIOGEL PI IND STRL 7.0 (GLOVE) ×1 IMPLANT
GLOVE BIOGEL PI IND STRL 8 (GLOVE) ×1 IMPLANT
GLOVE BIOGEL PI IND STRL 9 (GLOVE) ×1 IMPLANT
GLOVE BIOGEL PI INDICATOR 7.0 (GLOVE) ×1
GLOVE BIOGEL PI INDICATOR 8 (GLOVE) ×1
GLOVE BIOGEL PI INDICATOR 9 (GLOVE) ×1
GLOVE SURG ORTHO 9.0 STRL STRW (GLOVE) ×2 IMPLANT
GLOVE SURG SS PI 7.0 STRL IVOR (GLOVE) ×2 IMPLANT
GOWN STRL REUS W/ TWL LRG LVL3 (GOWN DISPOSABLE) ×1 IMPLANT
GOWN STRL REUS W/ TWL XL LVL3 (GOWN DISPOSABLE) ×2 IMPLANT
GOWN STRL REUS W/TWL LRG LVL3 (GOWN DISPOSABLE) ×1
GOWN STRL REUS W/TWL XL LVL3 (GOWN DISPOSABLE) ×2
KIT BASIN OR (CUSTOM PROCEDURE TRAY) ×2 IMPLANT
KIT ROOM TURNOVER OR (KITS) ×2 IMPLANT
MANIFOLD NEPTUNE II (INSTRUMENTS) IMPLANT
NEEDLE HYPO 25GX1X1/2 BEV (NEEDLE) IMPLANT
NS IRRIG 1000ML POUR BTL (IV SOLUTION) ×2 IMPLANT
PACK ORTHO EXTREMITY (CUSTOM PROCEDURE TRAY) ×2 IMPLANT
PAD ARMBOARD 7.5X6 YLW CONV (MISCELLANEOUS) ×2 IMPLANT
PAD CAST 4YDX4 CTTN HI CHSV (CAST SUPPLIES) IMPLANT
PADDING CAST COTTON 4X4 STRL (CAST SUPPLIES)
PLATE 5TH METATARSAL HOOK L LT (Plate) ×2 IMPLANT
SCREW CORTICAL 2.4X14 (Screw) ×4 IMPLANT
SPECIMEN JAR SMALL (MISCELLANEOUS) IMPLANT
SPONGE GAUZE 2X2 STER 10/PKG (GAUZE/BANDAGES/DRESSINGS)
SUCTION FRAZIER HANDLE 10FR (MISCELLANEOUS)
SUCTION TUBE FRAZIER 10FR DISP (MISCELLANEOUS) IMPLANT
SUT ETHILON 2 0 PSLX (SUTURE) IMPLANT
SUT VIC AB 2-0 CT1 27 (SUTURE)
SUT VIC AB 2-0 CT1 TAPERPNT 27 (SUTURE) IMPLANT
SYR CONTROL 10ML LL (SYRINGE) IMPLANT
TOWEL OR 17X24 6PK STRL BLUE (TOWEL DISPOSABLE) ×2 IMPLANT
TOWEL OR 17X26 10 PK STRL BLUE (TOWEL DISPOSABLE) ×2 IMPLANT
TUBE CONNECTING 12X1/4 (SUCTIONS) ×2 IMPLANT
WATER STERILE IRR 1000ML POUR (IV SOLUTION) IMPLANT

## 2015-12-20 NOTE — Op Note (Signed)
12/20/2015  10:49 AM  PATIENT:  Sydney Scott    PRE-OPERATIVE DIAGNOSIS:  Non-Union 5th MT Left Foot  POST-OPERATIVE DIAGNOSIS:  Same  PROCEDURE:  Internal Fixation Left 5th Metatarsal  SURGEON:  Nadara MustardMarcus V Montrell Cessna, MD  PHYSICIAN ASSISTANT:None ANESTHESIA:   General  PREOPERATIVE INDICATIONS:  Sydney Scott is a  82 y.o. female with a diagnosis of Non-Union 5th MT Left Foot who failed conservative measures and elected for surgical management.    The risks benefits and alternatives were discussed with the patient preoperatively including but not limited to the risks of infection, bleeding, nerve injury, cardiopulmonary complications, the need for revision surgery, among others, and the patient was willing to proceed.  OPERATIVE IMPLANTS: Arthrex anatomic plate  OPERATIVE FINDINGS: Fibrous nonunion  OPERATIVE PROCEDURE: Patient brought the operating room and underwent a general anesthetic after a popliteal block. After adequate levels anesthesia obtained patient's left lower extremity was prepped using DuraPrep draped into a sterile field a timeout was called. A lateral incision was made over the base of the fifth metatarsal. This was carried down to the fibrous nonunion. This area of fibrous nonunion was resected there was approximately 4 mm in width. This was debrided back to bleeding viable bone. The fracture was reduced a hooked plate was applied proximally and compression screws were used distally to compress the fracture. C-arm fluoroscopy verified alignment. Patient incision was irrigated with normal saline and closed with 2-0 nylon a sterile compressive dressing was applied patient was extubated taken to PACU in stable condition.`

## 2015-12-20 NOTE — Anesthesia Postprocedure Evaluation (Signed)
Anesthesia Post Note  Patient: Sydney Scott  Procedure(s) Performed: Procedure(s) (LRB): Internal Fixation Left 5th Metatarsal (Left)  Patient location during evaluation: PACU Anesthesia Type: MAC and Regional Level of consciousness: awake and alert Pain management: pain level controlled Vital Signs Assessment: post-procedure vital signs reviewed and stable Respiratory status: spontaneous breathing, nonlabored ventilation, respiratory function stable and patient connected to nasal cannula oxygen Cardiovascular status: stable and blood pressure returned to baseline Anesthetic complications: no    Last Vitals:  Vitals:   12/20/15 1054 12/20/15 1109  BP:  (!) 144/96  Pulse:  68  Resp:  17  Temp: 36.1 C     Last Pain:  Vitals:   12/20/15 1124  TempSrc:   PainSc: Asleep    LLE Motor Response: Purposeful movement (12/20/15 1124) LLE Sensation: Decreased (12/20/15 1124)          Virgie Chery S

## 2015-12-20 NOTE — Transfer of Care (Signed)
Immediate Anesthesia Transfer of Care Note  Patient: Sydney BlanksConnie Scott  Procedure(s) Performed: Procedure(s): Internal Fixation Left 5th Metatarsal (Left)  Patient Location: PACU  Anesthesia Type:MAC  Level of Consciousness: awake  Airway & Oxygen Therapy: Patient Spontanous Breathing and Patient connected to nasal cannula oxygen  Post-op Assessment: Report given to RN and Post -op Vital signs reviewed and stable  Post vital signs: Reviewed and stable  Last Vitals:  Vitals:   12/20/15 1007 12/20/15 1054  BP: (!) 148/101   Pulse: 68   Resp: 12   Temp:  (P) 36.1 C    Last Pain:  Vitals:   12/20/15 1054  TempSrc:   PainSc: (P) 0-No pain         Complications: No apparent anesthesia complications

## 2015-12-20 NOTE — Anesthesia Preprocedure Evaluation (Signed)
Anesthesia Evaluation  Patient identified by MRN, date of birth, ID band Patient awake    Reviewed: Allergy & Precautions, H&P , NPO status , Patient's Chart, lab work & pertinent test results  Airway Mallampati: II   Neck ROM: full    Dental   Pulmonary former smoker,    breath sounds clear to auscultation       Cardiovascular hypertension, + Peripheral Vascular Disease   Rhythm:regular Rate:Normal     Neuro/Psych    GI/Hepatic   Endo/Other    Renal/GU      Musculoskeletal  (+) Arthritis ,   Abdominal   Peds  Hematology  (+) anemia ,   Anesthesia Other Findings   Reproductive/Obstetrics                             Anesthesia Physical Anesthesia Plan  ASA: II  Anesthesia Plan: MAC and Regional   Post-op Pain Management:    Induction: Intravenous  Airway Management Planned:   Additional Equipment:   Intra-op Plan:   Post-operative Plan:   Informed Consent: I have reviewed the patients History and Physical, chart, labs and discussed the procedure including the risks, benefits and alternatives for the proposed anesthesia with the patient or authorized representative who has indicated his/her understanding and acceptance.     Plan Discussed with: CRNA, Anesthesiologist and Surgeon  Anesthesia Plan Comments:         Anesthesia Quick Evaluation

## 2015-12-20 NOTE — Progress Notes (Signed)
Orthopedic Tech Progress Note Patient Details:  Sydney BlanksConnie Scott 23-Apr-1927 [id number]  Ortho Devices Type of Ortho Device: CAM walker Ortho Device/Splint Interventions: Application   Sydney FordyceJennifer C Charish Scott 12/20/2015, 11:13 AM

## 2015-12-20 NOTE — H&P (Signed)
Sydney BlanksConnie Scott is an 82 y.o. female.   Chief Complaint: Nonunion Ace of the fifth metatarsal fracture left foot HPI: Patient is a 82 year old woman who is undergone conservative treatment for metaphyseal fracture base of the fifth metatarsal left foot she has developed a malunion nonunion and presents at this time for open reduction internal fixation.  Past Medical History:  Diagnosis Date  . AAA (abdominal aortic aneurysm) (HCC)    4.1 cm on CT 08-17-12  . AAA (abdominal aortic aneurysm) (HCC) 2014   CT Abd- 4.1 cm 12/19/15- no follow up  . Arthritis   . Hypertension    not on medication  . Pneumonia 2014    Past Surgical History:  Procedure Laterality Date  . NO PAST SURGERIES      Family History  Problem Relation Age of Onset  . CVA Mother   . Stroke Mother   . Heart failure Father    Social History:  reports that she quit smoking about 37 years ago. Her smoking use included Cigarettes. She quit after 20.00 years of use. She has never used smokeless tobacco. She reports that she does not drink alcohol or use drugs.  Allergies:  Allergies  Allergen Reactions  . Aspirin Nausea And Vomiting    No prescriptions prior to admission.    No results found for this or any previous visit (from the past 48 hour(s)). No results found.  Review of Systems  All other systems reviewed and are negative.   There were no vitals taken for this visit. Physical Exam on examination patient is alert oriented no adenopathy well-dressed normal affect respiratory effort she does have an antalgic gait she has good pulses she is tender to palpation over the base of the fifth metatarsal radiographs shows a nonunion.  Assessment/Plan Assessment: Nonunion base of the fifth metatarsal fracture left foot.  Plan: We'll plan for open reduction internal fixation. Risk and benefits were discussed including infection neurovascular injury persistent pain and need for additional surgery. Patient states she  understands wish to proceed at this time.  Nadara MustardMarcus V Kailen Hinkle, MD 12/20/2015, 6:32 AM

## 2015-12-20 NOTE — Anesthesia Procedure Notes (Signed)
Anesthesia Regional Block:  Popliteal block  Pre-Anesthetic Checklist: ,, timeout performed, Correct Patient, Correct Site, Correct Laterality, Correct Procedure, Correct Position, site marked, Risks and benefits discussed,  Surgical consent,  Pre-op evaluation,  At surgeon's request and post-op pain management  Laterality: Left  Prep: chloraprep       Needles:  Injection technique: Single-shot  Needle Type: Echogenic Stimulator Needle     Needle Length:cm 9 cm Needle Gauge: 21 G    Additional Needles:  Procedures: ultrasound guided (picture in chart) and nerve stimulator Popliteal block  Nerve Stimulator or Paresthesia:  Response: plantar flexion of foot, 0.45 mA,   Additional Responses:   Narrative:  Start time: 12/20/2015 9:56 AM End time: 12/20/2015 10:05 AM Injection made incrementally with aspirations every 5 mL.  Performed by: Personally  Anesthesiologist: Taraoluwa Thakur  Additional Notes: Functioning IV was confirmed and monitors were applied.  A 90mm 21ga Arrow echogenic stimulator needle was used. Sterile prep and drape,hand hygiene and sterile gloves were used.  Negative aspiration and negative test dose prior to incremental administration of local anesthetic. The patient tolerated the procedure well.  Ultrasound guidance: relevent anatomy identified, needle position confirmed, local anesthetic spread visualized around nerve(s), vascular puncture avoided.  Image printed for medical record.

## 2015-12-21 ENCOUNTER — Encounter (HOSPITAL_COMMUNITY): Payer: Self-pay | Admitting: Orthopedic Surgery

## 2015-12-21 DIAGNOSIS — S92352K Displaced fracture of fifth metatarsal bone, left foot, subsequent encounter for fracture with nonunion: Secondary | ICD-10-CM | POA: Diagnosis not present

## 2015-12-21 NOTE — Clinical Social Work Placement (Signed)
   CLINICAL SOCIAL WORK PLACEMENT  NOTE  Date:  12/21/2015  Patient Details  Name: Burnell BlanksConnie Camerer MRN: [id number] Date of Birth: 03-26-1927  Clinical Social Work is seeking post-discharge placement for this patient at the Skilled  Nursing Facility level of care (*CSW will initial, date and re-position this form in  chart as items are completed):      Patient/family provided with [facility] Clinical [facility] Work Department's list of facilities offering this level of care within the geographic area requested by the patient (or if unable, by the patient's family).      Patient/family informed of their freedom to choose among providers that offer the needed level of care, that participate in Medicare, Medicaid or managed care program needed by the patient, have an available bed and are willing to accept the patient.      Patient/family informed of [place]'s ownership interest in [facility] and [facility], as well as of the fact that they are under no obligation to receive care at these facilities.  PASRR submitted to EDS on       PASRR number received on 12/21/15     Existing PASRR number confirmed on       FL2 transmitted to all facilities in geographic area requested by pt/family on 12/21/15     FL2 transmitted to all facilities within larger geographic area on       Patient informed that his/her managed care company has contracts with or will negotiate with certain facilities, including the following:            Patient/family informed of bed offers received.  [facility]     Physician recommends and patient chooses bed at      [facility]  .  Patient to be transferred to facility by [facility]     Patient family notified on   of transfer.  Name of family member notified:        PHYSICIAN Please prepare priority discharge summary, including medications, Please prepare prescriptions, Please sign FL2      Additional Comment:    _______________________________________________ Volney AmericanBridget A Mayton, LCSW 12/21/2015, 12:43 PM

## 2015-12-21 NOTE — Care Management Obs Status (Signed)
MEDICARE OBSERVATION STATUS NOTIFICATION   Patient Details  Name: Sydney BlanksConnie Lorensen MRN: [id number] Date of Birth: 1927-05-22   Medicare Observation Status Notification Given:  Yes    Durenda GuthrieBrady, Renea Schoonmaker Naomi, RN 12/21/2015, 11:55 AM

## 2015-12-21 NOTE — Clinical Social Work Note (Signed)
Clinical Social Work Assessment  Patient Details  Name: Sydney Scott MRN: [id number] Date of Birth: [birth year]  Date of referral:  12/21/15               Reason for consult:  Facility Placement                Permission sought to share information with:  Family Supports Permission granted to share information::  Yes, Verbal Permission Granted  Name::     Sydney Scott  Agency::     Relationship::  Daughter in Diplomatic Services operational officerLaw  Contact Information:  [phone]  Housing/Transportation Living arrangements for the past 2 months:  Single Family Home Source of Information:  Patient Patient Interpreter Needed:  None Criminal Activity/Legal Involvement Pertinent to Current Situation/Hospitalization:  No - Comment as needed Significant Relationships:  Adult Children Lives with:  Self Do you feel safe going back to the place where you live?  Yes Need for family participation in patient care:  Yes (Comment)  Care giving concerns:  Pt mother in law present at bedside. Pt's mother in law inquiring about [facility]. CSW suggested that pt's mother in law call the facility.    Social Worker assessment / plan:  CSW spoke with pt at bedside. Pt lives at home alone. Pt reports she has plenty family in the area to help her after she gets home from rehab. Pt is agreeable to SNF placement at this time. Pt states she [facility]. Pt is very optimistic and motivated to get to rehab in order to become "independent" again. Pt reports she will go by [facility]. CSW will continue to facilitate SNF placement.   Employment status:  Retired Database administratornsurance information:  Managed Medicare PT Recommendations:  Skilled Nursing Facility Information / Referral to community resources:  Skilled Nursing Facility  Patient/Family's Response to care:  Pt verbalized understanding of CSW role and appreciation of support. Pt denies any questions regarding care at this time.  Patient/Family's Understanding of and  Emotional Response to Diagnosis, Current Treatment, and Prognosis:  Pt understanding and realistic regarding physical limitations. Pt agreeable to SNF at this time. Pt denies any question or concerns regarding treatment plan at this time.  Emotional Assessment Appearance:  Appears stated age Attitude/Demeanor/Rapport:   (Patient was appropriate.) Affect (typically observed):  Accepting, Appropriate, Pleasant Orientation:  Oriented to Self, Oriented to Place, Oriented to  Time, Oriented to Situation Alcohol / Substance use:  Not Applicable Psych involvement (Current and /or in the community):  No (Comment)  Discharge Needs  Concerns to be addressed:  No discharge needs identified Readmission within the last 30 days:  No Current discharge risk:  Dependent with Mobility Barriers to Discharge:  Continued Medical Work up   Safeway IncBridget A Mayton, LCSW 12/21/2015, 12:20 PM

## 2015-12-21 NOTE — Progress Notes (Signed)
Patient ID: [name], female   DOB: Feb 19, 1927, 82 y.o.   MRN: [id number] Postoperative day 1 open reduction internal fixation nonunion fracture base of the fifth metatarsal left foot. Physical therapy progressive ambulation nonweightbearing on the left. Evaluation for discharge to skilled nursing.

## 2015-12-21 NOTE — Evaluation (Signed)
Physical Therapy Evaluation Patient Details Name: Sydney BlanksConnie Scroggs MRN: [id number] DOB: [birth year] Today's Date: 12/21/2015   History of Present Illness  82 yo female admitted on 12/20/15 for ORIF following non-union fx. PMH significant for AAA and HTN.   Clinical Impression  Pt presents POD 1 and is moving well with therapy. Assisted pt to bathroom and pt performed face washing at the bathroom sink with min a for safety. Prior to admission, pt was completely independent and lived alone in a single level home. Pt does not have any family to assist her once she returns home and is non-weight bearing on LLE. Pt will benefit from SNF placement upon discharge in order to maximize her recovery. Pt will benefit from being seen acutely in order to address the below deficits in order to assist with return to PLOF.    Follow Up Recommendations SNF    Equipment Recommendations  None recommended by PT    Recommendations for Other Services       Precautions / Restrictions Precautions Precautions: Fall Restrictions Weight Bearing Restrictions: Yes LLE Weight Bearing: Non weight bearing      Mobility  Bed Mobility Overal bed mobility: Needs Assistance Bed Mobility: Supine to Sit     Supine to sit: Min assist;HOB elevated     General bed mobility comments: Min A for safety with HOB elevated  Transfers Overall transfer level: Needs assistance Equipment used: Rolling walker (2 wheeled) Transfers: Sit to/from Stand Sit to Stand: Min assist         General transfer comment: Min A for safety from EOB and on/off commode with cues for hand placement  Ambulation/Gait Ambulation/Gait assistance: Min assist Ambulation Distance (Feet): 40 Feet Assistive device: Rolling walker (2 wheeled) Gait Pattern/deviations: Step-to pattern Gait velocity: decreased Gait velocity interpretation: Below normal speed for age/gender General Gait Details: Hop to sequencing with cues for NWB status on  LLE  Stairs            Wheelchair Mobility    Modified Rankin (Stroke Patients Only)       Balance                                             Pertinent Vitals/Pain Pain Assessment: Faces Faces Pain Scale: Hurts little more Pain Location: left foot Pain Descriptors / Indicators: Guarding;Grimacing Pain Intervention(s): Monitored during session;Repositioned    Home Living Family/patient expects to be discharged to:: Skilled nursing facility Living Arrangements: Alone                    Prior Function Level of Independence: Independent         Comments: Independent and living alone prior to admission     Hand Dominance   Dominant Hand: Right    Extremity/Trunk Assessment   Upper Extremity Assessment: Overall WFL for tasks assessed           Lower Extremity Assessment: LLE deficits/detail   LLE Deficits / Details: grossly 3/5 knee and hip      Communication   Communication: No difficulties  Cognition Arousal/Alertness: Awake/alert Behavior During Therapy: WFL for tasks assessed/performed Overall Cognitive Status: Within Functional Limits for tasks assessed                      General Comments      Exercises     Assessment/Plan  PT Assessment Patient needs continued PT services  PT Problem List Decreased strength;Decreased activity tolerance;Decreased balance;Decreased knowledge of use of DME;Decreased safety awareness;Pain          PT Treatment Interventions DME instruction;Gait training;Functional mobility training;Therapeutic activities;Therapeutic exercise;Balance training;Patient/family education    PT Goals (Current goals can be found in the Care Plan section)  Acute Rehab PT Goals Patient Stated Goal: rehab then home PT Goal Formulation: With patient Time For Goal Achievement: 12/28/15 Potential to Achieve Goals: Good    Frequency Min 3X/week   Barriers to discharge Decreased caregiver  support no assistance is she returns home     Co-evaluation               End of Session Equipment Utilized During Treatment: Gait belt Activity Tolerance: Patient tolerated treatment well Patient left: in chair;with call bell/phone within reach;with family/visitor present Nurse Communication: Mobility status    Functional Assessment Tool Used: clinician experience, gait analysis, functional mobility assessment Functional Limitation: Mobility: Walking and moving around Mobility: Walking and Moving Around Current Status ([id number](G8978): At least 20 percent but less than 40 percent impaired, limited or restricted Mobility: Walking and Moving Around Goal Status [phone](G8979): At least 1 percent but less than 20 percent impaired, limited or restricted    Time: 1139-1158 PT Time Calculation (min) (ACUTE ONLY): 19 min   Charges:   PT Evaluation $PT Eval Low Complexity: 1 Procedure     PT G Codes:   PT G-Codes **NOT FOR INPATIENT CLASS** Functional Assessment Tool Used: clinician experience, gait analysis, functional mobility assessment Functional Limitation: Mobility: Walking and moving around Mobility: Walking and Moving Around Current Status (U9811(G8978): At least 20 percent but less than 40 percent impaired, limited or restricted Mobility: Walking and Moving Around Goal Status [phone](G8979): At least 1 percent but less than 20 percent impaired, limited or restricted    Colin BroachSabra M. Corayma Cashatt PT, DPT  [phone]  12/21/2015, 12:18 PM

## 2015-12-21 NOTE — [place] FL2 (Signed)
[place] MEDICAID FL2 LEVEL OF CARE SCREENING TOOL     IDENTIFICATION  Patient Name: Sydney BlanksConnie Bayona Birthdate: July 19, 1927 Sex: female Admission Date (Current Location): 12/20/2015  [facility] [place]   Facility and Address:  [facility], [address], [place], [place] [address]      Provider Number: [id number]  Attending Physician Name and Address:  Nadara MustardMarcus V Duda, MD  Relative Name and Phone Number:       Current Level of Care: Hospital Recommended Level of Care: Skilled Nursing Facility Prior Approval Number:    Date Approved/Denied: 12/21/15 PASRR Number: [id number] [id number]  Discharge Plan: SNF    Current Diagnoses: Patient Active Problem List   Diagnosis Date Noted  . 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 Metatarsal stress fracture with nonunion, left   . Left ankle injury 10/11/2015  . HTN (hypertension) 08/26/2012  . AAA (abdominal aortic aneurysm) without rupture (HCC) 08/26/2012  . Liver abscess 08/06/2012  . Bacteremia 08/02/2012  . Weakness 07/27/2012  . UTI (urinary tract infection) 07/27/2012  . Leukocytosis, unspecified 07/27/2012  . Anemia 07/27/2012  . Hypokalemia 07/27/2012    Orientation RESPIRATION BLADDER Height & Weight     Self, Time, Situation, Place  Normal Continent Weight: 125 lb (56.7 kg) Height:  5\' 3"  (160 cm)  BEHAVIORAL SYMPTOMS/MOOD NEUROLOGICAL BOWEL NUTRITION STATUS      Continent  (Please see discharge summary)  AMBULATORY STATUS COMMUNICATION OF NEEDS Skin   Limited Assist Verbally Surgical wounds (Closed incision left foot compression wrap dressing)                       Personal Care Assistance Level of Assistance  Bathing, Feeding, Dressing Bathing Assistance: Limited assistance Feeding assistance: Independent Dressing Assistance: Limited assistance     Functional Limitations Info  Hearing, Speech, Sight Sight Info:  Adequate Hearing Info: Adequate Speech Info: Adequate    SPECIAL CARE FACTORS FREQUENCY  PT (By licensed PT), OT (By licensed OT)     PT Frequency: min 3x week OT Frequency: min 3x week            Contractures Contractures Info: Not present    Additional Factors Info  Code Status, Allergies Code Status Info: Full Allergies Info: Aspirin           Current Medications (12/21/2015):  This is the current hospital active medication list Current Facility-Administered Medications  Medication Dose Route Frequency Provider Last Rate Last Dose  . 0.9 %  sodium chloride infusion   Intravenous Continuous Nadara MustardMarcus V Duda, MD 10 mL/hr at 12/20/15 1623    . acetaminophen (TYLENOL) tablet 650 mg  650 mg Oral Q6H PRN Nadara MustardMarcus V Duda, MD       Or  . acetaminophen (TYLENOL) suppository 650 mg  650 mg Rectal Q6H PRN Nadara MustardMarcus V Duda, MD      . aspirin EC tablet 325 mg  325 mg Oral Daily Nadara MustardMarcus V Duda, MD      . HYDROmorphone (DILAUDID) injection 1 mg  1 mg Intravenous Q2H PRN Nadara MustardMarcus V Duda, MD      . methocarbamol (ROBAXIN) tablet 500 mg  500 mg Oral Q6H PRN Nadara MustardMarcus V Duda, MD       Or  . methocarbamol (ROBAXIN) 500 mg in dextrose 5 % 50 mL IVPB  500 mg Intravenous Q6H PRN Nadara MustardMarcus V Duda, MD      . metoCLOPramide [facility](REGLAN)  tablet 5-10 mg  5-10 mg Oral Q8H PRN Nadara MustardMarcus V Duda, MD       Or  . metoCLOPramide (REGLAN) injection 5-10 mg  5-10 mg Intravenous Q8H PRN Nadara MustardMarcus V Duda, MD      . ondansetron [facility](ZOFRAN) tablet 4 mg  4 mg Oral Q6H PRN Nadara MustardMarcus V Duda, MD       Or  . ondansetron [facility](ZOFRAN) injection 4 mg  4 mg Intravenous Q6H PRN Nadara MustardMarcus V Duda, MD      . oxyCODONE (Oxy IR/ROXICODONE) immediate release tablet 5-10 mg  5-10 mg Oral Q3H PRN Nadara MustardMarcus V Duda, MD   10 mg at 12/21/15 0037     Discharge Medications: Please see discharge summary for a list of discharge medications.  Relevant Imaging Results:  Relevant Lab Results:   Additional Information SSN: [id number]  Volney AmericanBridget A Mayton, LCSW

## 2015-12-22 MED ORDER — OXYCODONE-ACETAMINOPHEN 10-325 MG PO TABS: 1.0000 | ORAL_TABLET | Freq: Four times a day (QID) | ORAL | 0 refills | Status: AC | PRN

## 2015-12-22 NOTE — Progress Notes (Signed)
Patient ID: [name], female   DOB: [birth year], 82 y.o.   MRN: [id number] Discharge summary and orders completed, patient may discharge to skilled nursing when bed is available

## 2015-12-22 NOTE — Progress Notes (Signed)
[facility] IV removed. Patient is waiting for [facility] for transport

## 2015-12-22 NOTE — Discharge Summary (Signed)
Discharge Diagnoses:  Active Problems:   Closed fracture of base of fifth metatarsal bone with nonunion, left   Metatarsal stress fracture with nonunion, left   Surgeries: Procedure(s): Internal Fixation Left 5th Metatarsal on 12/20/2015    Consultants:  none  Discharged Condition: Improved  Hospital Course: Sydney BlanksConnie Scott is an 82 y.o. female who was admitted 12/20/2015 with a chief complaint of nonunion base of the fifth metatarsal fracture left foot, with a final diagnosis of Non-Union 5th MT Left Foot.  Patient was brought to the operating room on 12/20/2015 and underwent Procedure(s): Internal Fixation Left 5th Metatarsal.    Patient was given perioperative antibiotics: Anti-infectives    Start     Dose/Rate Route Frequency Ordered Stop   12/20/15 1600  ceFAZolin (ANCEF) IVPB 1 g/50 mL premix     1 g 100 mL/hr over 30 Minutes Intravenous Every 6 hours 12/20/15 1523 12/21/15 0509   12/20/15 1015  ceFAZolin (ANCEF) IVPB 2g/100 mL premix     2 g 200 mL/hr over 30 Minutes Intravenous On call to O.R. 12/20/15 [id number] 12/20/15 1011    .  Patient was given sequential compression devices, early ambulation, and aspirin for DVT prophylaxis.  Recent vital signs: Patient Vitals for the past 24 hrs:  BP Temp Temp src Pulse Resp SpO2  12/22/15 0528 120/65 98 F (36.7 C) Oral 70 16 98 %  12/21/15 1957 (!) 165/86 98.1 F (36.7 C) Oral 72 16 98 %  12/21/15 1231 (!) 164/83 98.7 F (37.1 C) - 76 16 98 %  .  Recent laboratory studies: No results found.  Discharge Medications:     Medication List    STOP taking these medications   nitroGLYCERIN 0.2 mg/hr patch Commonly known as:  NITRODUR - Dosed in mg/24 hr     TAKE these medications   ibuprofen 400 MG tablet Commonly known as:  ADVIL,MOTRIN Take 1 tablet (400 mg total) by mouth every 6 (six) hours as needed.   oxyCODONE-acetaminophen 10-325 MG tablet Commonly known as:  PERCOCET Take 1 tablet by mouth every 6 (six) hours as needed  for pain. What changed:  Another medication with the same name was added. Make sure you understand how and when to take each.   oxyCODONE-acetaminophen 10-325 MG tablet Commonly known as:  PERCOCET Take 1 tablet by mouth every 6 (six) hours as needed for pain. What changed:  You were already taking a medication with the same name, and this prescription was added. Make sure you understand how and when to take each.       Diagnostic Studies: No results found.  Patient benefited maximally from their hospital stay and there were no complications.     Disposition: 01-Home or Self Care Discharge Instructions    Call MD / Call 911    Complete by:  As directed    If you experience chest pain or shortness of breath, CALL 911 and be transported to the hospital emergency room.  If you develope a fever above 101 F, pus (white drainage) or increased drainage or redness at the wound, or calf pain, call your surgeon's office.   Constipation Prevention    Complete by:  As directed    Drink plenty of fluids.  Prune juice may be helpful.  You may use a stool softener, such as Colace (over the counter) 100 mg twice a day.  Use MiraLax (over the counter) for constipation as needed.   Diet - low sodium heart healthy  Complete by:  As directed    Increase activity slowly as tolerated    Complete by:  As directed    Touch down weight bearing    Complete by:  As directed    Laterality:  left   Extremity:  Lower     Follow-up Information    Nadara MustardMarcus V Duda, MD In 2 weeks.   Specialty:  Orthopedic Surgery Contact information: [address] [place] [address] [phone]            Signed: Nadara MustardMarcus V Duda 12/22/2015, 6:28 AM

## 2015-12-22 NOTE — Care Management (Signed)
[facility] will [name]. Social worker has arranged.

## 2015-12-22 NOTE — Clinical Social Work Placement (Addendum)
RN TO CALL REPORT TO [phone]  CLINICAL SOCIAL WORK PLACEMENT  NOTE  Date:  12/22/2015  Patient Details  Name: Sydney Scott MRN: [id number] Date of Birth: 1927-08-15  Clinical Social Work is seeking post-discharge placement for this patient at the Skilled  Nursing Facility level of care (*CSW will initial, date and re-position this form in  chart as items are completed):  Yes   Patient/family provided with [facility] Clinical Social Work Department's list of facilities offering this level of care within the geographic area requested by the patient (or if unable, by the patient's family).  Yes   Patient/family informed of their freedom to choose among providers that offer the needed level of care, that participate in Medicare, Medicaid or managed care program needed by the patient, have an available bed and are willing to accept the patient.  Yes   Patient/family informed of [facility]'s ownership interest in [facility] and [facility], as well as of the fact that they are under no obligation to receive care at these facilities.  PASRR submitted to EDS on       PASRR number received on 12/21/15     Existing PASRR number confirmed on       FL2 transmitted to all facilities in geographic area requested by pt/family on 12/21/15     FL2 transmitted to all facilities within larger geographic area on       Patient informed that his/her managed care company has contracts with or will negotiate with certain facilities, including the following:        Yes   Patient/family informed of bed offers received.  [name]     Physician recommends and patient chooses bed at      [name] on 12/22/15.  Patient to be transferred to facility by PTAR     Patient family notified on 12/22/15 of transfer.  Name of family member notified:  Ambulance     PHYSICIAN Please prepare priority discharge summary, including  medications, Please prepare prescriptions, Please sign FL2     Additional Comment:   Per MD patient ready for [name]. RN, patient, patient's family Andrez Grime(Janice Pelham), and facility notified of DC. RN given number for report. DC packet on chart. Ambulance transport requested for patient. CSW signing off.  _______________________________________________ Venita Lickampbell, Law Corsino B, LCSW 12/22/2015, 1:28 PM

## 2015-12-23 ENCOUNTER — Encounter (HOSPITAL_COMMUNITY): Payer: Self-pay | Admitting: Orthopedic Surgery

## 2015-12-23 NOTE — OR Nursing (Signed)
LATE ENTRY: Documented missing implants  Verified with RN circulator, vendor and physician documentation.

## 2015-12-28 ENCOUNTER — Inpatient Hospital Stay (INDEPENDENT_AMBULATORY_CARE_PROVIDER_SITE_OTHER): Payer: Medicare Other | Admitting: Orthopedic Surgery

## 2016-01-02 ENCOUNTER — Encounter (HOSPITAL_COMMUNITY): Payer: Self-pay | Admitting: Orthopedic Surgery

## 2016-01-03 ENCOUNTER — Telehealth (INDEPENDENT_AMBULATORY_CARE_PROVIDER_SITE_OTHER): Payer: Self-pay | Admitting: Orthopedic Surgery

## 2016-01-03 NOTE — Telephone Encounter (Signed)
Faxed order for touch down weight bearing left foot.

## 2016-01-03 NOTE — Telephone Encounter (Signed)
Brandi with [name] called.. Needs Weight bearing limitations. Callback [phone]..fax [phone]

## 2016-01-04 ENCOUNTER — Ambulatory Visit (INDEPENDENT_AMBULATORY_CARE_PROVIDER_SITE_OTHER): Payer: Medicare Other | Admitting: Orthopedic Surgery

## 2016-01-04 ENCOUNTER — Ambulatory Visit (INDEPENDENT_AMBULATORY_CARE_PROVIDER_SITE_OTHER): Payer: Medicare Other

## 2016-01-04 ENCOUNTER — Inpatient Hospital Stay (INDEPENDENT_AMBULATORY_CARE_PROVIDER_SITE_OTHER): Payer: Medicare Other | Admitting: Orthopedic Surgery

## 2016-01-04 VITALS — Ht 63.0 in | Wt 125.0 lb

## 2016-01-04 DIAGNOSIS — M79672 Pain in left foot: Secondary | ICD-10-CM | POA: Diagnosis not present

## 2016-01-04 DIAGNOSIS — S92302G Fracture of unspecified metatarsal bone(s), left foot, subsequent encounter for fracture with delayed healing: Secondary | ICD-10-CM

## 2016-01-04 NOTE — Progress Notes (Signed)
Office Visit Note   Patient: Sydney Scott           Date of BirBurnell Scott: 1927/03/31           MRN: [id number] Visit Date: 01/04/2016              Requested by: No referring provider defined for this encounter. PCP: No PCP Per Patient   Assessment & Plan: Visit Diagnoses:  1. Delayed union of metatarsal fracture, left   2. Pain in left foot     Plan: Follow-up in 2 weeks to harvest the sutures. Start dry dressing changes daily with dialysis of cleansing daily touchdown weightbearing with a fracture boot for gait training.  Follow-Up Instructions: Return in about 2 weeks (around 01/18/2016).   Orders:  Orders Placed This Encounter  Procedures  . XR Foot 2 Views Left   No orders of the defined types were placed in this encounter.     Procedures: No procedures performed   Clinical Data: No additional findings.   Subjective: Chief Complaint  Patient presents with  . Left Foot - Routine Post Op    12/20/15 s/p Internal Fixation Left 5th Metatarsal    Pt is 2 weeks and 1 day s/p Internal Fixation Left 5th Metatarsal. Patient is non weight bearing in a wheelchair and fracture boot. She states that she is not having any pain at all and has not taken any pain medication since last week. The incision is well approximated and the stitches are intact. The patient does not have any questions today.    Review of Systems   Objective: Vital Signs: Ht 5\' 3"  (1.6 m)   Wt 125 lb (56.7 kg)   BMI 22.14 kg/m   Physical Exam On examination the incision is healing slowly there is a small area with a clear serosanguineous drainage. We will leave the sutures in place for 2 more weeks. There is no redness no cellulitis no odor no signs of infection. Ortho Exam  Specialty Comments:  No specialty comments available.  Imaging: Xr Foot 2 Views Left  Result Date: 01/04/2016 2 view radiographs of left foot show stable internal fixation for the nonunion fifth metatarsal  fracture.    PMFS History: Patient Active Problem List   Diagnosis Date Noted  . Delayed union of metatarsal fracture, left 01/04/2016  . 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 Metatarsal stress fracture with nonunion, left   . Left ankle injury 10/11/2015  . HTN (hypertension) 08/26/2012  . AAA (abdominal aortic aneurysm) without rupture (HCC) 08/26/2012  . Liver abscess 08/06/2012  . Bacteremia 08/02/2012  . Weakness 07/27/2012  . UTI (urinary tract infection) 07/27/2012  . Leukocytosis, unspecified 07/27/2012  . Anemia 07/27/2012  . Hypokalemia 07/27/2012   Past Medical History:  Diagnosis Date  . AAA (abdominal aortic aneurysm) (HCC)    4.1 cm on CT 08-17-12  . AAA (abdominal aortic aneurysm) (HCC) 2014   CT Abd- 4.1 cm 12/19/15- no follow up  . Arthritis   . Hypertension    not on medication  . Pneumonia 2014    Family History  Problem Relation Age of Onset  . CVA Mother   . Stroke Mother   . Heart failure Father     Past Surgical History:  Procedure Laterality Date  . ORIF TOE FRACTURE Left 12/20/2015   Procedure: Internal Fixation Left 5th Metatarsal;  Surgeon: Nadara MustardMarcus V Duda, MD;  Location: [facility] OR;  Service: Orthopedics;  Laterality: Left;   Social History   Occupational History  . Not on file.   Social History Main Topics  . Smoking status: Former Smoker    Years: 20.00    Types: Cigarettes    Quit date: 07/28/1978  . Smokeless tobacco: Never Used  . Alcohol use No  . Drug use: No  . Sexual activity: Not on file

## 2016-01-10 ENCOUNTER — Telehealth (INDEPENDENT_AMBULATORY_CARE_PROVIDER_SITE_OTHER): Payer: Self-pay | Admitting: Orthopedic Surgery

## 2016-01-10 NOTE — Telephone Encounter (Signed)
Tried returning call numerous times there is no answer. No way to leave a voicemail, continuous beeping only.

## 2016-01-10 NOTE — Telephone Encounter (Signed)
[facility] for the start of care for pt  to begin today. Almamelissa [phone]

## 2016-01-18 ENCOUNTER — Ambulatory Visit (INDEPENDENT_AMBULATORY_CARE_PROVIDER_SITE_OTHER): Payer: Medicare Other | Admitting: Orthopedic Surgery

## 2016-01-18 ENCOUNTER — Ambulatory Visit (INDEPENDENT_AMBULATORY_CARE_PROVIDER_SITE_OTHER): Payer: Medicare Other

## 2016-01-18 ENCOUNTER — Encounter (INDEPENDENT_AMBULATORY_CARE_PROVIDER_SITE_OTHER): Payer: Self-pay | Admitting: Orthopedic Surgery

## 2016-01-18 VITALS — Ht 63.0 in | Wt 125.0 lb

## 2016-01-18 DIAGNOSIS — S92352K Displaced fracture of fifth metatarsal bone, left foot, subsequent encounter for fracture with nonunion: Secondary | ICD-10-CM

## 2016-01-18 DIAGNOSIS — M79672 Pain in left foot: Secondary | ICD-10-CM

## 2016-01-18 NOTE — Progress Notes (Signed)
Office Visit Note   Patient: Sydney BlanksConnie Scott           Date of Birth: 1927-12-27           MRN: [id number] Visit Date: 01/18/2016              Requested by: No referring provider defined for this encounter. PCP: No PCP Per Patient   Assessment & Plan: Visit Diagnoses:  1. Pain in left foot   2. 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 Continue daily wound cleansing and dry dressing.   Follow-Up Instructions: Return in about 2 weeks (around 02/01/2016).   Orders:  Orders Placed This Encounter  Procedures  . XR Foot Complete Left   No orders of the defined types were placed in this encounter.     Procedures: No procedures performed   Clinical Data: No additional findings.   Subjective: Chief Complaint  Patient presents with  . Left Foot - Routine Post Op    Internal fixation left 5th metatarsal 12/20/15. 4 weeks post op. She denies pain. Incision is well approximated. There is no drainage. She is nonweightbearing with cam walker and wheelchair today.     Patient is 83 year old woman seen status post internal fixation of the left foot 5th metatarsal. Is 4 weeks out. Sutures remain in place. Has been touch down weight bearing in a fracture boot. No complaints.     Review of Systems  Constitutional: Negative for chills and fever.     Objective: Vital Signs: Ht 5\' 3"  (1.6 m)   Wt 125 lb (56.7 kg)   BMI 22.14 kg/m   Physical Exam  Constitutional: She is oriented to person, place, and time. She appears well-developed and well-nourished.  Pulmonary/Chest: Effort normal.  Musculoskeletal:  No bony tenderness.   Neurological: She is alert and oriented to person, place, and time.  Skin:  Incision is well healed. No drainage. No erythema. Sutures harvested today without incident.  Psychiatric: She has a normal mood and affect.  Nursing note  reviewed.   Ortho Exam  Specialty Comments:  No specialty comments available.  Imaging: Xr Foot Complete Left  Result Date: 01/18/2016 Radiographs of the left foot show stable alignment of internal fixation. No complicating features.     PMFS History: Patient Active Problem List   Diagnosis Date Noted  . Delayed union of metatarsal fracture, left 01/04/2016  . 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 Metatarsal stress fracture with nonunion, left   . Left ankle injury 10/11/2015  . HTN (hypertension) 08/26/2012  . AAA (abdominal aortic aneurysm) without rupture (HCC) 08/26/2012  . Liver abscess 08/06/2012  . Bacteremia 08/02/2012  . Weakness 07/27/2012  . UTI (urinary tract infection) 07/27/2012  . Leukocytosis, unspecified 07/27/2012  . Anemia 07/27/2012  . Hypokalemia 07/27/2012   Past Medical History:  Diagnosis Date  . AAA (abdominal aortic aneurysm) (HCC)    4.1 cm on CT 08-17-12  . AAA (abdominal aortic aneurysm) (HCC) 2014   CT Abd- 4.1 cm 12/19/15- no follow up  . Arthritis   . Hypertension    not on medication  . Pneumonia 2014    Family History  Problem Relation Age of Onset  . CVA Mother   . Stroke Mother   . Heart failure Father     Past Surgical History:  Procedure Laterality Date  . ORIF TOE FRACTURE Left 12/20/2015   Procedure: Internal Fixation Left 5th Metatarsal;  Surgeon: Nadara Mustard, MD;  Location: [facility] OR;  Service: Orthopedics;  Laterality: Left;   Social History   Occupational History  . Not on file.   Social History Main Topics  . Smoking status: Former Smoker    Years: 20.00    Types: Cigarettes    Quit date: 07/28/1978  . Smokeless tobacco: Never Used  . Alcohol use No  . Drug use: No  . Sexual activity: Not on file

## 2016-01-26 ENCOUNTER — Telehealth (INDEPENDENT_AMBULATORY_CARE_PROVIDER_SITE_OTHER): Payer: Self-pay | Admitting: Orthopedic Surgery

## 2016-01-26 NOTE — Telephone Encounter (Signed)
Pt daughter in law called to state pt stitches are out from surg and her foot is really sore and tender, wants to talk and discuss what can be done moving forward.  Liborio NixonJanice [phone]- [phone]

## 2016-01-26 NOTE — Telephone Encounter (Signed)
Advised to have patient keep weight off foot. Advised that pain could be from swelling, compression, icing, rest, elevation recommended. Patient has not been changing dressing since last office visit. Advised to have her wash foot with soap and water daily and change dressing daily. They have appt next Friday and will call back if they wish to be seen sooner, declined at this time.

## 2016-02-01 ENCOUNTER — Ambulatory Visit (INDEPENDENT_AMBULATORY_CARE_PROVIDER_SITE_OTHER): Payer: Medicare Other | Admitting: Orthopedic Surgery

## 2016-02-06 NOTE — Telephone Encounter (Signed)
Finished

## 2016-02-15 ENCOUNTER — Ambulatory Visit (INDEPENDENT_AMBULATORY_CARE_PROVIDER_SITE_OTHER): Payer: Medicare Other | Admitting: Orthopedic Surgery

## 2016-02-15 ENCOUNTER — Encounter (INDEPENDENT_AMBULATORY_CARE_PROVIDER_SITE_OTHER): Payer: Self-pay | Admitting: Orthopedic Surgery

## 2016-02-15 VITALS — Ht 63.0 in | Wt 125.0 lb

## 2016-02-15 DIAGNOSIS — S92302G Fracture of unspecified metatarsal bone(s), left foot, subsequent encounter for fracture with delayed healing: Secondary | ICD-10-CM

## 2016-02-15 NOTE — Progress Notes (Signed)
   Office Visit Note   Patient: Sydney BlanksConnie Keir           Date of Birth: 1927/01/28           MRN: [id number] Visit Date: 02/15/2016              Requested by: No referring provider defined for this encounter. PCP: No PCP Per Patient  Chief Complaint  Patient presents with  . Left Foot - Routine Post Op    Internal Fixation Left 5th Metatarsal 12/20/15    HPI: Patient presents today for follow up left foot internal fixation 5th metatarsal. She is not currently taking anything for pain. She complains with discomfort dependent on the weather. She is full weightbearing. There is scabbing over lateral incision without drainage.Donalee CitrinStepheney L Peele, RT    Assessment & Plan: Visit Diagnoses: No diagnosis found.  Plan: Follow-up as needed. We will advance to regular shoewear  Follow-Up Instructions: No Follow-up on file.   Ortho Exam Patient is asymptomatic at this time with ambulation. The incision is well-healed there is no cellulitis no signs of infection. Recommended continue scar massage with lotion.  Imaging: No results found.  Orders:  No orders of the defined types were placed in this encounter.  No orders of the defined types were placed in this encounter.    Procedures: No procedures performed  Clinical Data: No additional findings.  Subjective: Review of Systems  Objective: Vital Signs: There were no vitals taken for this visit.  Specialty Comments:  No specialty comments available.  PMFS History: Patient Active Problem List   Diagnosis Date Noted  . Delayed union of metatarsal fracture, left 01/04/2016  . 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 Metatarsal stress fracture with nonunion, left   . Left ankle injury 10/11/2015  . HTN (hypertension) 08/26/2012  . AAA (abdominal aortic aneurysm) without rupture (HCC) 08/26/2012  . Liver abscess 08/06/2012  . Bacteremia 08/02/2012  . Weakness 07/27/2012  . UTI  (urinary tract infection) 07/27/2012  . Leukocytosis, unspecified 07/27/2012  . Anemia 07/27/2012  . Hypokalemia 07/27/2012   Past Medical History:  Diagnosis Date  . AAA (abdominal aortic aneurysm) (HCC)    4.1 cm on CT 08-17-12  . AAA (abdominal aortic aneurysm) (HCC) 2014   CT Abd- 4.1 cm 12/19/15- no follow up  . Arthritis   . Hypertension    not on medication  . Pneumonia 2014    Family History  Problem Relation Age of Onset  . CVA Mother   . Stroke Mother   . Heart failure Father     Past Surgical History:  Procedure Laterality Date  . ORIF TOE FRACTURE Left 12/20/2015   Procedure: Internal Fixation Left 5th Metatarsal;  Surgeon: Nadara MustardMarcus V Arleatha Philipps, MD;  Location: [facility] OR;  Service: Orthopedics;  Laterality: Left;   Social History   Occupational History  . Not on file.   Social History Main Topics  . Smoking status: Former Smoker    Years: 20.00    Types: Cigarettes    Quit date: 07/28/1978  . Smokeless tobacco: Never Used  . Alcohol use No  . Drug use: No  . Sexual activity: Not on file

## 2017-09-04 ENCOUNTER — Other Ambulatory Visit: Payer: Self-pay

## 2017-09-04 ENCOUNTER — Emergency Department (HOSPITAL_BASED_OUTPATIENT_CLINIC_OR_DEPARTMENT_OTHER)
Admission: EM | Admit: 2017-09-04 | Discharge: 2017-09-04 | Disposition: A | Discharge status: Other Acute Inpatient Hospital | Payer: Medicare Other | Attending: Emergency Medicine | Admitting: Emergency Medicine

## 2017-09-04 ENCOUNTER — Emergency Department (HOSPITAL_BASED_OUTPATIENT_CLINIC_OR_DEPARTMENT_OTHER): Payer: Medicare Other

## 2017-09-04 ENCOUNTER — Encounter (HOSPITAL_BASED_OUTPATIENT_CLINIC_OR_DEPARTMENT_OTHER): Payer: Self-pay | Admitting: Emergency Medicine

## 2017-09-04 DIAGNOSIS — Z87891 Personal history of nicotine dependence: Secondary | ICD-10-CM | POA: Insufficient documentation

## 2017-09-04 DIAGNOSIS — I714 Abdominal aortic aneurysm, without rupture, unspecified: Secondary | ICD-10-CM

## 2017-09-04 DIAGNOSIS — I10 Essential (primary) hypertension: Secondary | ICD-10-CM | POA: Diagnosis not present

## 2017-09-04 DIAGNOSIS — R1032 Left lower quadrant pain: Secondary | ICD-10-CM | POA: Diagnosis present

## 2017-09-04 LAB — COMPREHENSIVE METABOLIC PANEL
ALK PHOS: 91 U/L (ref 38–126)
ALT: 14 U/L (ref 0–44)
ANION GAP: 10 (ref 5–15)
AST: 22 U/L (ref 15–41)
Albumin: 3.8 g/dL (ref 3.5–5.0)
BILIRUBIN TOTAL: 0.8 mg/dL (ref 0.3–1.2)
BUN: 11 mg/dL (ref 8–23)
CALCIUM: 9.1 mg/dL (ref 8.9–10.3)
CO2: 27 mmol/L (ref 22–32)
CREATININE: 0.83 mg/dL (ref 0.44–1.00)
Chloride: 105 mmol/L (ref 98–111)
GFR calc Af Amer: >60 mL/min (ref >60)
Glucose, Bld: 106 mg/dL — ABNORMAL HIGH (ref 70–99)
Potassium: 3.9 mmol/L (ref 3.5–5.1)
Sodium: 142 mmol/L (ref 135–145)
TOTAL PROTEIN: 7.9 g/dL (ref 6.5–8.1)

## 2017-09-04 LAB — CBC WITH DIFFERENTIAL/PLATELET
BASOS ABS: 0 10*3/uL (ref 0.0–0.1)
BASOS PCT: 0 %
EOS ABS: 0.1 10*3/uL (ref 0.0–0.7)
Eosinophils Relative: 1 %
HCT: 40.7 % (ref 36.0–46.0)
HEMOGLOBIN: 14.6 g/dL (ref 12.0–15.0)
Lymphocytes Relative: 23 %
Lymphs Abs: 2.1 10*3/uL (ref 0.7–4.0)
MCH: 29 pg (ref 26.0–34.0)
MCHC: 35.9 g/dL (ref 30.0–36.0)
MCV: 80.9 fL (ref 78.0–100.0)
MONOS PCT: 6 %
Monocytes Absolute: 0.6 10*3/uL (ref 0.1–1.0)
NEUTROS PCT: 70 %
Neutro Abs: 6.4 10*3/uL (ref 1.7–7.7)
Platelets: 203 10*3/uL (ref 150–400)
RBC: 5.03 MIL/uL (ref 3.87–5.11)
RDW: 16.4 % — ABNORMAL HIGH (ref 11.5–15.5)
WBC: 9.2 10*3/uL (ref 4.0–10.5)

## 2017-09-04 LAB — URINALYSIS, ROUTINE W REFLEX MICROSCOPIC
BILIRUBIN URINE: NEGATIVE
Glucose, UA: NEGATIVE mg/dL
HGB URINE DIPSTICK: NEGATIVE
Ketones, ur: NEGATIVE mg/dL
Leukocytes, UA: NEGATIVE
NITRITE: NEGATIVE
PROTEIN: NEGATIVE mg/dL
Specific Gravity, Urine: <1.005 — ABNORMAL LOW (ref 1.005–1.030)
pH: 6.5 (ref 5.0–8.0)

## 2017-09-04 LAB — LIPASE, BLOOD: LIPASE: 25 U/L (ref 11–51)

## 2017-09-04 MED ORDER — LABETALOL HCL 5 MG/ML IV SOLN
0.5000 mg/min | INTRAVENOUS | Status: DC
  Administered 2017-09-04: 0.5 mg/min via INTRAVENOUS
  Filled 2017-09-04: qty 100

## 2017-09-04 MED ORDER — IOPAMIDOL (ISOVUE-300) INJECTION 61%
100.0000 mL | Freq: Once | INTRAVENOUS | Status: AC | PRN
  Administered 2017-09-04: 100 mL via INTRAVENOUS

## 2017-09-04 MED ORDER — FENTANYL CITRATE (PF) 100 MCG/2ML IJ SOLN
25.0000 ug | Freq: Once | INTRAMUSCULAR | Status: AC
  Administered 2017-09-04: 25 ug via INTRAVENOUS
  Filled 2017-09-04: qty 2

## 2017-09-04 MED ORDER — LORAZEPAM 2 MG/ML IJ SOLN
INTRAMUSCULAR | Status: AC
  Administered 2017-09-04: 1 mg
  Filled 2017-09-04: qty 1

## 2017-09-04 MED ORDER — MORPHINE SULFATE (PF) 4 MG/ML IV SOLN
4.0000 mg | Freq: Once | INTRAVENOUS | Status: AC
  Administered 2017-09-04: 4 mg via INTRAVENOUS
  Filled 2017-09-04: qty 1

## 2017-09-04 MED ORDER — ONDANSETRON HCL 4 MG/2ML IJ SOLN
INTRAMUSCULAR | Status: AC
  Administered 2017-09-04: 4 mg
  Filled 2017-09-04: qty 2

## 2017-09-04 MED ORDER — IOPAMIDOL (ISOVUE-300) INJECTION 61%
30.0000 mL | Freq: Once | INTRAVENOUS | Status: AC | PRN
  Administered 2017-09-04: 30 mL via ORAL

## 2017-09-04 NOTE — ED Provider Notes (Addendum)
[facility] EMERGENCY DEPARTMENT Provider Note   CSN: [id number] Arrival date & time: 09/04/17  0940     History   Chief Complaint Chief Complaint  Patient presents with  . Abdominal Pain    HPI Sydney Scott is a 84 y.o. female.  Patient is a 84 year old female who presents with a 2 to 3-day history of some worsening pain in her left lower abdomen.  It radiates to her left mid back.  She denies any nausea or vomiting.  No factors that make it better or worse.  She has been constipated and states that she normally has a bowel movement about 2 times a day but has not had a normal bowel movement about 3 or 4 days.  No known fevers.  No urinary symptoms.  She has not taken anything at home for the pain.  Of note, she was admitted 2014 for healthcare associated pneumonia and also developed a liver abscess.     Past Medical History:  Diagnosis Date  . AAA (abdominal aortic aneurysm) (HCC)    4.1 cm on CT 08-17-12  . AAA (abdominal aortic aneurysm) (HCC) 2014   CT Abd- 4.1 cm 12/19/15- no follow up  . Arthritis   . Hypertension    not on medication  . Pneumonia 2014    Patient Active Problem List   Diagnosis Date Noted  . Delayed union of metatarsal fracture, left 01/04/2016  . 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 Metatarsal stress fracture with nonunion, left   . Left ankle injury 10/11/2015  . HTN (hypertension) 08/26/2012  . AAA (abdominal aortic aneurysm) without rupture (HCC) 08/26/2012  . Liver abscess 08/06/2012  . Bacteremia 08/02/2012  . Weakness 07/27/2012  . UTI (urinary tract infection) 07/27/2012  . Leukocytosis, unspecified 07/27/2012  . Anemia 07/27/2012  . Hypokalemia 07/27/2012    Past Surgical History:  Procedure Laterality Date  . ORIF TOE FRACTURE Left 12/20/2015   Procedure: Internal Fixation Left 5th Metatarsal;  Surgeon: Nadara Mustard, MD;  Location: [facility] OR;  Service: Orthopedics;  Laterality:  Left;     OB History   None      Home Medications    Prior to Admission medications   Medication Sig Start Date End Date Taking? Authorizing Provider  ibuprofen (ADVIL,MOTRIN) 400 MG tablet Take 1 tablet (400 mg total) by mouth every 6 (six) hours as needed. Patient not taking: Reported on 02/15/2016 09/30/15   Mesner, Barbara Cower, MD  oxyCODONE-acetaminophen (PERCOCET) 10-325 MG tablet Take 1 tablet by mouth every 6 (six) hours as needed for pain. Patient not taking: Reported on 01/18/2016 12/15/15   Adonis Huguenin, NP  oxyCODONE-acetaminophen (PERCOCET) 10-325 MG tablet Take 1 tablet by mouth every 6 (six) hours as needed for pain. Patient not taking: Reported on 01/18/2016 12/22/15   Nadara Mustard, MD    Family History Family History  Problem Relation Age of Onset  . CVA Mother   . Stroke Mother   . Heart failure Father     Social History Social History   Tobacco Use  . Smoking status: Former Smoker    Years: 20.00    Types: Cigarettes    Last attempt to quit: 07/28/1978    Years since quitting: 39.1  . Smokeless tobacco: Never Used  Substance Use Topics  . Alcohol use: No  . Drug use: No     Allergies   Aspirin   Review of Systems Review of Systems  Constitutional: Negative for chills, diaphoresis, fatigue and fever.  HENT: Negative for congestion, rhinorrhea and sneezing.   Eyes: Negative.   Respiratory: Negative for cough, chest tightness and shortness of breath.   Cardiovascular: Negative for chest pain and leg swelling.  Gastrointestinal: Positive for abdominal pain and constipation. Negative for blood in stool, diarrhea, nausea and vomiting.  Genitourinary: Negative for difficulty urinating, flank pain, frequency and hematuria.  Musculoskeletal: Negative for arthralgias and back pain.  Skin: Negative for rash.  Neurological: Negative for dizziness, speech difficulty, weakness, numbness and headaches.     Physical Exam Updated Vital Signs BP (!) 145/106    Pulse 75   Temp 98.7 F (37.1 C) (Oral)   Resp 15   SpO2 96%   Physical Exam  Constitutional: She is oriented to person, place, and time. She appears well-developed and well-nourished.  HENT:  Head: Normocephalic and atraumatic.  Eyes: Pupils are equal, round, and reactive to light.  Neck: Normal range of motion. Neck supple.  Cardiovascular: Normal rate, regular rhythm and normal heart sounds.  Pulmonary/Chest: Effort normal and breath sounds normal. No respiratory distress. She has no wheezes. She has no rales. She exhibits no tenderness.  Abdominal: Soft. Bowel sounds are normal. There is tenderness in the left lower quadrant. There is no rebound and no guarding.  Genitourinary:  Genitourinary Comments: No impaction is noted  Musculoskeletal: Normal range of motion. She exhibits no edema.  Lymphadenopathy:    She has no cervical adenopathy.  Neurological: She is alert and oriented to person, place, and time.  Skin: Skin is warm and dry. No rash noted.  Psychiatric: She has a normal mood and affect.     ED Treatments / Results  Labs (all labs ordered are listed, but only abnormal results are displayed) Labs Reviewed  COMPREHENSIVE METABOLIC PANEL - Abnormal; Notable for the following components:      Result Value   Glucose, Bld 106 (*)    All other components within normal limits  CBC WITH DIFFERENTIAL/PLATELET - Abnormal; Notable for the following components:   RDW 16.4 (*)    All other components within normal limits  URINALYSIS, ROUTINE W REFLEX MICROSCOPIC - Abnormal; Notable for the following components:   Specific Gravity, Urine <1.005 (*)    All other components within normal limits  LIPASE, BLOOD    EKG EKG Interpretation  Date/Time:  Thursday September 04 2017 13:31:04 EDT Ventricular Rate:  79 PR Interval:    QRS Duration: 92 QT Interval:  465 QTC Calculation: 534 R Axis:   -41 Text Interpretation:  Sinus rhythm Left anterior fascicular block Anterior  infarct, old Prolonged QT interval Baseline wander in lead(s) V2 since last tracing no significant change Confirmed by Rolan Bucco [phone]) on 09/04/2017 1:38:43 PM   Radiology Ct Abdomen Pelvis W Contrast  Result Date: 09/04/2017 CLINICAL DATA:  LEFT lower quadrant LEFT flank pain for 5 days, constipation for 2-3 days, question diverticulitis, history hypertension and abdominal aortic aneurysm EXAM: CT ABDOMEN AND PELVIS WITH CONTRAST TECHNIQUE: Multidetector CT imaging of the abdomen and pelvis was performed using the standard protocol following bolus administration of intravenous contrast. Sagittal and coronal MPR images reconstructed from axial data set. CONTRAST:  30mL ISOVUE-300 IOPAMIDOL (ISOVUE-300) INJECTION 61% PO , ISOVUE-300 IOPAMIDOL (ISOVUE-300) INJECTION 61% IV COMPARISON:  08/17/2012 FINDINGS: Lower chest: Small low-attenuation LEFT pleural effusion and LEFT lower lobe subsegmental atelectasis. Hepatobiliary: Contracted gallbladder with wall calcification at fundus consistent with porcelain gallbladder. No focal hepatic abnormalities. Intrahepatic and  extrahepatic biliary dilatation with common hepatic duct 14 mm diameter and common bile duct 11 mm. Pancreas: Normal appearance Spleen: Normal appearance Adrenals/Urinary Tract: Cysts at inferior pole RIGHT kidney larger 3.8 cm in greatest size. Adrenal glands, kidneys, and ureters otherwise normal appearance. Bladder moderately well distended with multiple small diverticula. Stomach/Bowel: Extensive pan colonic diverticulosis without evidence of diverticulitis. Normal appendix. Stomach and bowel loops otherwise normal appearance. Vascular/Lymphatic: Extensive atherosclerotic calcifications aorta, iliac arteries, coronary arteries and abdominal branch vessels. Aneurysmal dilatation of distal descending thoracic aorta 6.7 cm transverse image 13 (previously 3.7 cm greatest diameter). Aneurysmal dilatation of mid abdominal aorta just below SMA  origin, 4.2 cm diameter (previously 3.0 cm greatest diameter). Marked dilatation of distal abdominal aorta 6.5 x 6.8 cm, extending at least 11.4 cm length and extending into the aortic bifurcation (previously 4.0 cm greatest diameter). Aneurysmal dilatation RIGHT common iliac artery 3.1 cm transverse (previously 2.1 cm greatest diameter). Aneurysmal dilatation of the descending thoracic, abdominal aorta and RIGHT common iliac artery are all progressive since 2014. Extensive thrombus within descending thoracic and abdominal aorta extending into RIGHT common iliac artery. Along the RIGHT lateral aspect of the distal abdominal aortic aneurysm, abnormal density seen external to the wall calcification, eccentric, not seen elsewhere at the aorta, cannot exclude subtle aneurysm leak. Reproductive: Uterine calcifications likely related to degenerative uterine leiomyomata. Unremarkable adnexa. Other: No free air or free fluid.  No definite inflammatory process. Musculoskeletal: Osseous demineralization. IMPRESSION: Pancolonic diverticulosis without definite evidence of diverticulitis. Multiple small bladder diverticula. 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 Extensive atherosclerotic disease changes with following aneurysmal dilatation: Distal descending thoracic aorta 6.7 cm, previously 3.7 cm greatest diameter Mid abdominal aorta 4.2 cm, previously 3.0 cm Distal abdominal aorta 6.5 x 6.8 x 11.4 cm, previously 4.0 cm greatest size RIGHT common iliac artery 3.1 cm, previously 2.1 cm Subtle infiltration along the RIGHT lateral aspect of the large distal abdominal aortic aneurysm, cannot exclude aneurysm leakage. Aortic Atherosclerosis (ICD10-I70.0). Aortic aneurysm NOS (ICD10-I71.9). Findings called to Dr. Fredderick Phenix on 09/04/2017 at 1304 hours. Electronically Signed   By: Ulyses Southward M.D.   On: 09/04/2017 13:08    Procedures Procedures (including  critical care time)  Medications Ordered in ED Medications  labetalol (NORMODYNE,TRANDATE) 500 mg in dextrose 5 % 125 mL (4 mg/mL) infusion (0.5 mg/min Intravenous New Bag/Given 09/04/17 1354)  fentaNYL (SUBLIMAZE) injection 25 mcg (25 mcg Intravenous Given 09/04/17 1102)  iopamidol (ISOVUE-300) 61 % injection 30 mL (30 mLs Oral Contrast Given 09/04/17 1010)  iopamidol (ISOVUE-300) 61 % injection 100 mL (100 mLs Intravenous Contrast Given 09/04/17 1221)  morphine 4 MG/ML injection 4 mg (4 mg Intravenous Given 09/04/17 1336)     Initial Impression / Assessment and Plan / ED Course  I have reviewed the triage vital signs and the nursing notes.  Pertinent labs & imaging results that were available during my care of the patient were reviewed by me and considered in my medical decision making (see chart for details).     Patient is a 84 year old female who complains of left-sided abdominal flank pain.  She has evidence of markedly increased aortic aneurysms on CT.  The largest one has increased to around 7 cm.  There is some slight infiltration adjacent to the aneurysm was the radiologist is concerned about being a small leak.  She has been hemodynamically stable.  She is gotten opioids for pain management.  Her hemoglobin is stable.  I spoke with  Dr. Chestine Sporelark with vascular surgery and he [facility].  I [facility] Jacqu[facility] who is aware of the patient coming.   Dr. Chestine Sporelark has reviewed the CT images and feels that this is a type III thoraco-abdominal aneurysm.  [facility] to fix this type of aneurysm.  He does see the stranding in the concern for imminent rupture.  He advises to [facility].  I did speak with Dr. Benna DunksBarber with vascular surgery who has [facility]  However there was a delay because the patient has to be excepted by the EDP.  I  spoke with Dr. Harvie JuniorHowarth in the emergency department who has accepted the patient for transfer.  We did request air transport but due to the weather, they are not able to fly here.  [facility] is on scene and well take the [facility].  I have had a discussion with the patient regarding surgery.  She does want to go ahead with surgery.  I have explained to her the gravity of the situation and there is a high likelihood of death should the aorta rupture.  I did ask her whether she wanted to be resuscitated if she were to go into cardiac arrest in route and she does say that she does.  PT was started on a labetalol drip and it is being titrated to maintain a SBP<120 and HR<80 per Dr. Ophelia Charterlark's recommendations.  CRITICAL CARE Performed by: Rolan BuccoMelanie Levonte Molina Total critical care time: 90 minutes Critical care time was exclusive of separately billable procedures and treating other patients. Critical care was necessary to treat or prevent imminent or life-threatening deterioration. Critical care was time spent personally by me on the following activities: development of treatment plan with patient and/or surrogate as well as nursing, discussions with consultants, evaluation of patient's response to treatment, examination of patient, obtaining history from patient or surrogate, ordering and performing treatments and interventions, ordering and review of laboratory studies, ordering and review of radiographic studies, pulse oximetry and re-evaluation of patient's condition.  Final Clinical Impressions(s) / ED Diagnoses   Final diagnoses:  Abdominal aortic aneurysm (AAA) greater than 5.0 cm in diameter in female [facility](HCC)    ED Discharge Orders    None       Rolan BuccoBelfi, Denzal Meir, MD 09/04/17 1418    Rolan BuccoBelfi, Tinia Oravec, MD 09/04/17 1419

## 2017-09-04 NOTE — ED Triage Notes (Signed)
Pain in LLQ and Left flank x5 days.  Getting worse.  Pt sts her bowels have not moved in 2-3 days.  Denies any urinary sx.

## 2017-09-04 NOTE — ED Notes (Signed)
pts son Caryn Bee(Kevin [phone]) notified she is being transferred to [facility]

## 2017-09-04 NOTE — ED Notes (Signed)
[facility] CN Jessica,RN notified transfer has been cancelled, [facility]

## 2017-09-04 NOTE — ED Notes (Signed)
[facility] at bedside to take Pt.

## 2017-09-04 NOTE — ED Notes (Signed)
ED Provider at bedside. 

## 2017-09-04 NOTE — ED Notes (Signed)
[facility]

## 2017-09-04 NOTE — ED Notes (Signed)
Dr. Fredderick PhenixBelfi now at bedside speaking with Pt. [facility]

## 2018-01-19 ENCOUNTER — Telehealth: Payer: Self-pay | Admitting: Osteopathic Medicine

## 2018-01-19 ENCOUNTER — Ambulatory Visit: Payer: Medicare Other | Admitting: Osteopathic Medicine

## 2018-01-19 NOTE — Telephone Encounter (Signed)
Let's make sure they don't get a no-show charge then. Toniann Fail? Thanks!

## 2018-01-19 NOTE — Telephone Encounter (Signed)
Routing to scheduler

## 2018-01-19 NOTE — Telephone Encounter (Signed)
No-show to establish care with Dr. Lyn Hollingshead, 01/19/18. If late or no-show again, will not accept patient to this clinic. Please call her to reschedule visit and inform her of this policy.

## 2018-01-19 NOTE — Telephone Encounter (Signed)
Contacted patient and spoke to daughter in law, she stated that patient passed away 12-May-2022.

## 2018-01-20 NOTE — Telephone Encounter (Signed)
Practice Administrator has been notified.

## 2018-01-20 NOTE — Telephone Encounter (Signed)
I went in and cancelled the appointment.
